# Patient Record
Sex: Female | Born: 1960
Health system: Southern US, Community
[De-identification: ages and names within clinical notes are randomized; demographics above are authoritative.]

## PROBLEM LIST (undated history)

## (undated) DIAGNOSIS — F419 Anxiety disorder, unspecified: Secondary | ICD-10-CM

## (undated) DIAGNOSIS — M3313 Other dermatomyositis without myopathy: Secondary | ICD-10-CM

## (undated) DIAGNOSIS — R519 Headache, unspecified: Secondary | ICD-10-CM

## (undated) DIAGNOSIS — G43909 Migraine, unspecified, not intractable, without status migrainosus: Secondary | ICD-10-CM

## (undated) DIAGNOSIS — D75839 Thrombocytosis, unspecified: Secondary | ICD-10-CM

## (undated) DIAGNOSIS — M3390 Dermatopolymyositis, unspecified, organ involvement unspecified: Secondary | ICD-10-CM

## (undated) DIAGNOSIS — R51 Headache: Secondary | ICD-10-CM

## (undated) HISTORY — PX: ABDOMINAL HYSTERECTOMY: SHX81

## (undated) HISTORY — DX: Anxiety disorder, unspecified: F41.9

## (undated) HISTORY — PX: BUNIONECTOMY: SHX129

## (undated) HISTORY — DX: Thrombocytosis, unspecified: D75.839

## (undated) HISTORY — PX: NECK SURGERY: SHX720

---

## 2001-05-14 ENCOUNTER — Encounter (HOSPITAL_COMMUNITY): Admission: RE | Admit: 2001-05-14 | Discharge: 2001-06-13 | Payer: Self-pay | Admitting: Oncology

## 2001-05-14 ENCOUNTER — Encounter: Admission: RE | Admit: 2001-05-14 | Discharge: 2001-05-14 | Payer: Self-pay | Admitting: Oncology

## 2001-06-27 ENCOUNTER — Encounter (HOSPITAL_COMMUNITY): Admission: RE | Admit: 2001-06-27 | Discharge: 2001-07-27 | Payer: Self-pay | Admitting: Oncology

## 2001-06-27 ENCOUNTER — Encounter: Admission: RE | Admit: 2001-06-27 | Discharge: 2001-06-27 | Payer: Self-pay | Admitting: Oncology

## 2002-08-18 ENCOUNTER — Emergency Department (HOSPITAL_COMMUNITY): Admission: EM | Admit: 2002-08-18 | Discharge: 2002-08-18 | Payer: Self-pay | Admitting: Internal Medicine

## 2002-09-10 ENCOUNTER — Ambulatory Visit (HOSPITAL_COMMUNITY): Admission: RE | Admit: 2002-09-10 | Discharge: 2002-09-10 | Payer: Self-pay | Admitting: Internal Medicine

## 2002-09-10 ENCOUNTER — Encounter: Payer: Self-pay | Admitting: Internal Medicine

## 2002-09-25 ENCOUNTER — Encounter: Payer: Self-pay | Admitting: Neurosurgery

## 2002-09-25 ENCOUNTER — Encounter: Admission: RE | Admit: 2002-09-25 | Discharge: 2002-09-25 | Payer: Self-pay | Admitting: Neurosurgery

## 2002-10-15 ENCOUNTER — Encounter: Payer: Self-pay | Admitting: Neurosurgery

## 2002-10-15 ENCOUNTER — Encounter: Admission: RE | Admit: 2002-10-15 | Discharge: 2002-10-15 | Payer: Self-pay | Admitting: Neurosurgery

## 2002-11-29 ENCOUNTER — Emergency Department (HOSPITAL_COMMUNITY): Admission: EM | Admit: 2002-11-29 | Discharge: 2002-11-29 | Payer: Self-pay | Admitting: Emergency Medicine

## 2002-12-02 ENCOUNTER — Encounter: Payer: Self-pay | Admitting: Neurosurgery

## 2002-12-02 ENCOUNTER — Inpatient Hospital Stay (HOSPITAL_COMMUNITY): Admission: RE | Admit: 2002-12-02 | Discharge: 2002-12-06 | Payer: Self-pay | Admitting: Neurosurgery

## 2003-04-14 ENCOUNTER — Ambulatory Visit (HOSPITAL_COMMUNITY): Admission: RE | Admit: 2003-04-14 | Discharge: 2003-04-14 | Payer: Self-pay | Admitting: Internal Medicine

## 2003-04-14 ENCOUNTER — Encounter: Payer: Self-pay | Admitting: Internal Medicine

## 2003-05-01 ENCOUNTER — Encounter: Payer: Self-pay | Admitting: Internal Medicine

## 2003-05-01 ENCOUNTER — Ambulatory Visit (HOSPITAL_COMMUNITY): Admission: RE | Admit: 2003-05-01 | Discharge: 2003-05-01 | Payer: Self-pay | Admitting: Internal Medicine

## 2004-08-15 ENCOUNTER — Emergency Department (HOSPITAL_COMMUNITY): Admission: EM | Admit: 2004-08-15 | Discharge: 2004-08-15 | Payer: Self-pay | Admitting: Emergency Medicine

## 2004-08-16 ENCOUNTER — Inpatient Hospital Stay (HOSPITAL_COMMUNITY): Admission: EM | Admit: 2004-08-16 | Discharge: 2004-08-20 | Payer: Self-pay | Admitting: *Deleted

## 2004-11-04 ENCOUNTER — Ambulatory Visit (HOSPITAL_COMMUNITY): Admission: RE | Admit: 2004-11-04 | Discharge: 2004-11-04 | Payer: Self-pay | Admitting: Internal Medicine

## 2005-01-21 ENCOUNTER — Ambulatory Visit: Payer: Self-pay | Admitting: Internal Medicine

## 2005-01-24 ENCOUNTER — Ambulatory Visit (HOSPITAL_COMMUNITY): Admission: RE | Admit: 2005-01-24 | Discharge: 2005-01-24 | Payer: Self-pay | Admitting: Internal Medicine

## 2005-02-04 ENCOUNTER — Ambulatory Visit: Payer: Self-pay | Admitting: Internal Medicine

## 2005-02-04 ENCOUNTER — Ambulatory Visit (HOSPITAL_COMMUNITY): Admission: RE | Admit: 2005-02-04 | Discharge: 2005-02-04 | Payer: Self-pay | Admitting: Internal Medicine

## 2005-03-29 ENCOUNTER — Ambulatory Visit: Payer: Self-pay | Admitting: Internal Medicine

## 2005-10-12 ENCOUNTER — Ambulatory Visit (HOSPITAL_COMMUNITY): Admission: RE | Admit: 2005-10-12 | Discharge: 2005-10-12 | Payer: Self-pay | Admitting: Internal Medicine

## 2006-03-24 ENCOUNTER — Emergency Department (HOSPITAL_COMMUNITY): Admission: EM | Admit: 2006-03-24 | Discharge: 2006-03-24 | Payer: Self-pay | Admitting: Emergency Medicine

## 2008-04-07 ENCOUNTER — Ambulatory Visit (HOSPITAL_COMMUNITY): Admission: RE | Admit: 2008-04-07 | Discharge: 2008-04-07 | Payer: Self-pay | Admitting: Internal Medicine

## 2008-06-30 ENCOUNTER — Emergency Department (HOSPITAL_COMMUNITY): Admission: EM | Admit: 2008-06-30 | Discharge: 2008-06-30 | Payer: Self-pay | Admitting: Emergency Medicine

## 2008-11-17 ENCOUNTER — Encounter: Payer: Self-pay | Admitting: Orthopedic Surgery

## 2008-11-18 ENCOUNTER — Encounter: Payer: Self-pay | Admitting: Orthopedic Surgery

## 2008-11-18 ENCOUNTER — Ambulatory Visit (HOSPITAL_COMMUNITY): Admission: RE | Admit: 2008-11-18 | Discharge: 2008-11-18 | Payer: Self-pay | Admitting: Orthopedic Surgery

## 2008-11-19 ENCOUNTER — Ambulatory Visit: Payer: Self-pay | Admitting: Orthopedic Surgery

## 2008-11-19 DIAGNOSIS — M79609 Pain in unspecified limb: Secondary | ICD-10-CM

## 2008-11-19 DIAGNOSIS — G5760 Lesion of plantar nerve, unspecified lower limb: Secondary | ICD-10-CM | POA: Insufficient documentation

## 2008-12-31 ENCOUNTER — Ambulatory Visit: Payer: Self-pay | Admitting: Orthopedic Surgery

## 2008-12-31 DIAGNOSIS — M21619 Bunion of unspecified foot: Secondary | ICD-10-CM

## 2009-01-06 ENCOUNTER — Telehealth: Payer: Self-pay | Admitting: Orthopedic Surgery

## 2009-03-19 ENCOUNTER — Telehealth: Payer: Self-pay | Admitting: Orthopedic Surgery

## 2009-06-12 ENCOUNTER — Emergency Department: Payer: Self-pay | Admitting: Emergency Medicine

## 2009-06-14 ENCOUNTER — Encounter: Payer: Self-pay | Admitting: Emergency Medicine

## 2009-06-14 ENCOUNTER — Inpatient Hospital Stay (HOSPITAL_COMMUNITY): Admission: EM | Admit: 2009-06-14 | Discharge: 2009-06-19 | Payer: Self-pay | Admitting: Internal Medicine

## 2009-07-15 ENCOUNTER — Ambulatory Visit (HOSPITAL_COMMUNITY): Admission: RE | Admit: 2009-07-15 | Discharge: 2009-07-15 | Payer: Self-pay | Admitting: General Surgery

## 2009-07-15 ENCOUNTER — Encounter (INDEPENDENT_AMBULATORY_CARE_PROVIDER_SITE_OTHER): Payer: Self-pay | Admitting: General Surgery

## 2011-01-16 ENCOUNTER — Encounter: Payer: Self-pay | Admitting: Obstetrics and Gynecology

## 2011-01-16 ENCOUNTER — Encounter: Payer: Self-pay | Admitting: Unknown Physician Specialty

## 2011-03-31 ENCOUNTER — Other Ambulatory Visit (HOSPITAL_COMMUNITY): Payer: Self-pay | Admitting: Family Medicine

## 2011-03-31 DIAGNOSIS — Z01419 Encounter for gynecological examination (general) (routine) without abnormal findings: Secondary | ICD-10-CM

## 2011-04-04 LAB — CBC
HCT: 40.1 % (ref 36.0–46.0)
HCT: 39 % (ref 36.0–46.0)
HCT: 40.4 % (ref 36.0–46.0)
Hemoglobin: 14.3 g/dL (ref 12.0–15.0)
MCHC: 35.7 g/dL (ref 30.0–36.0)
MCHC: 34.6 g/dL (ref 30.0–36.0)
MCV: 91.9 fL (ref 78.0–100.0)
MCV: 93.3 fL (ref 78.0–100.0)
Platelets: 126 10*3/uL — ABNORMAL LOW (ref 150–400)
Platelets: 110 10*3/uL — ABNORMAL LOW (ref 150–400)
Platelets: 115 10*3/uL — ABNORMAL LOW (ref 150–400)
RBC: 4.37 MIL/uL (ref 3.87–5.11)
RBC: 3.94 MIL/uL (ref 3.87–5.11)
RDW: 12.8 % (ref 11.5–15.5)
RDW: 12.7 % (ref 11.5–15.5)
RDW: 12.9 % (ref 11.5–15.5)
WBC: 5.8 10*3/uL (ref 4.0–10.5)
WBC: 12.8 10*3/uL — ABNORMAL HIGH (ref 4.0–10.5)

## 2011-04-04 LAB — COMPREHENSIVE METABOLIC PANEL
ALT: 148 U/L — ABNORMAL HIGH (ref 0–35)
ALT: 164 U/L — ABNORMAL HIGH (ref 0–35)
AST: 133 U/L — ABNORMAL HIGH (ref 0–37)
Alkaline Phosphatase: 66 U/L (ref 39–117)
BUN: 11 mg/dL (ref 6–23)
CO2: 26 mEq/L (ref 19–32)
Calcium: 9.1 mg/dL (ref 8.4–10.5)
Calcium: 9.6 mg/dL (ref 8.4–10.5)
Chloride: 105 mEq/L (ref 96–112)
Creatinine, Ser: 0.6 mg/dL (ref 0.4–1.2)
GFR calc Af Amer: >60 mL/min (ref >60)
GFR calc non Af Amer: >60 mL/min (ref >60)
Glucose, Bld: 145 mg/dL — ABNORMAL HIGH (ref 70–99)
Glucose, Bld: 96 mg/dL (ref 70–99)
Sodium: 138 mEq/L (ref 135–145)
Sodium: 140 mEq/L (ref 135–145)
Total Bilirubin: 0.4 mg/dL (ref 0.3–1.2)
Total Protein: 6.5 g/dL (ref 6.0–8.3)
Total Protein: 6.8 g/dL (ref 6.0–8.3)

## 2011-04-04 LAB — JO-1 ANTIBODY-IGG: Jo-1 Antibody, IgG: <0.2 AI (ref <1.0)

## 2011-04-04 LAB — BASIC METABOLIC PANEL
BUN: 13 mg/dL (ref 6–23)
CO2: 30 mEq/L (ref 19–32)
Calcium: 9.6 mg/dL (ref 8.4–10.5)
Chloride: 97 mEq/L (ref 96–112)
Creatinine, Ser: 0.61 mg/dL (ref 0.4–1.2)
GFR calc Af Amer: >60 mL/min (ref >60)
GFR calc non Af Amer: >60 mL/min (ref >60)
Glucose, Bld: 119 mg/dL — ABNORMAL HIGH (ref 70–99)
Potassium: 3.7 mEq/L (ref 3.5–5.1)
Sodium: 137 mEq/L (ref 135–145)

## 2011-04-04 LAB — DIFFERENTIAL
Basophils Absolute: 0 10*3/uL (ref 0.0–0.1)
Basophils Relative: 0 % (ref 0–1)
Eosinophils Absolute: 0 10*3/uL (ref 0.0–0.7)
Eosinophils Relative: 1 % (ref 0–5)
Lymphocytes Relative: 13 % (ref 12–46)
Lymphs Abs: 0.8 10*3/uL (ref 0.7–4.0)
Monocytes Absolute: 0.3 10*3/uL (ref 0.1–1.0)
Monocytes Relative: 6 % (ref 3–12)
Neutro Abs: 4.7 10*3/uL (ref 1.7–7.7)
Neutrophils Relative %: 81 % — ABNORMAL HIGH (ref 43–77)

## 2011-04-04 LAB — URINALYSIS, ROUTINE W REFLEX MICROSCOPIC
Bilirubin Urine: NEGATIVE
Glucose, UA: NEGATIVE mg/dL
Hgb urine dipstick: NEGATIVE
Ketones, ur: NEGATIVE mg/dL
Nitrite: NEGATIVE
Protein, ur: NEGATIVE mg/dL
Specific Gravity, Urine: 1.01 (ref 1.005–1.030)
Urobilinogen, UA: 0.2 mg/dL (ref 0.0–1.0)
pH: 7.5 (ref 5.0–8.0)

## 2011-04-04 LAB — SEDIMENTATION RATE: Sed Rate: 8 mm/hr (ref 0–22)

## 2011-04-04 LAB — RHEUMATOID FACTOR: Rhuematoid fact SerPl-aCnc: <20 IU/mL (ref 0–20)

## 2011-04-04 LAB — LACTATE DEHYDROGENASE: LDH: 213 U/L (ref 94–250)

## 2011-04-04 LAB — GLUCOSE, CAPILLARY
Glucose-Capillary: 120 mg/dL — ABNORMAL HIGH (ref 70–99)
Glucose-Capillary: 145 mg/dL — ABNORMAL HIGH (ref 70–99)
Glucose-Capillary: 154 mg/dL — ABNORMAL HIGH (ref 70–99)
Glucose-Capillary: 160 mg/dL — ABNORMAL HIGH (ref 70–99)
Glucose-Capillary: 163 mg/dL — ABNORMAL HIGH (ref 70–99)
Glucose-Capillary: 195 mg/dL — ABNORMAL HIGH (ref 70–99)

## 2011-04-04 LAB — CK
Total CK: 992 U/L — ABNORMAL HIGH (ref 7–177)
Total CK: 821 U/L — ABNORMAL HIGH (ref 7–177)
Total CK: 896 U/L — ABNORMAL HIGH (ref 7–177)

## 2011-04-04 LAB — VITAMIN B12: Vitamin B-12: 331 pg/mL (ref 211–911)

## 2011-04-04 LAB — CHOLINESTERASE, RBC: Cholinesterase, RBC: 37 U/g{Hb} (ref 25–52)

## 2011-04-04 LAB — B. BURGDORFI ANTIBODIES: B burgdorferi Ab IgG+IgM: 0.12 {ISR}

## 2011-04-04 LAB — SJOGRENS SYNDROME-B EXTRACTABLE NUCLEAR ANTIBODY: SSB (La) (ENA) Antibody, IgG: <0.2 AI (ref <1.0)

## 2011-04-04 LAB — ROCKY MTN SPOTTED FVR AB, IGM-BLOOD: RMSF IgM: 0.19 IV (ref 0.00–0.89)

## 2011-04-04 LAB — ROCKY MTN SPOTTED FVR AB, IGG-BLOOD: RMSF IgG: 0.06 IV

## 2011-04-04 LAB — MPO/PR-3 (ANCA) ANTIBODIES

## 2011-04-04 LAB — ANTI-NEUTROPHIL ANTIBODY

## 2011-04-07 ENCOUNTER — Other Ambulatory Visit (HOSPITAL_COMMUNITY): Payer: Self-pay

## 2011-04-07 ENCOUNTER — Ambulatory Visit (HOSPITAL_COMMUNITY): Payer: Self-pay

## 2011-04-11 ENCOUNTER — Ambulatory Visit (HOSPITAL_COMMUNITY)
Admission: RE | Admit: 2011-04-11 | Discharge: 2011-04-11 | Disposition: A | Discharge status: Home / Self Care | Payer: 59 | Source: Ambulatory Visit | Attending: Family Medicine | Admitting: Family Medicine

## 2011-04-11 DIAGNOSIS — Z01419 Encounter for gynecological examination (general) (routine) without abnormal findings: Secondary | ICD-10-CM

## 2011-04-11 DIAGNOSIS — M818 Other osteoporosis without current pathological fracture: Secondary | ICD-10-CM | POA: Insufficient documentation

## 2011-04-11 DIAGNOSIS — Z1231 Encounter for screening mammogram for malignant neoplasm of breast: Secondary | ICD-10-CM | POA: Insufficient documentation

## 2011-05-10 NOTE — Op Note (Signed)
NAMEJAMESETTA, Amy Goodman              ACCOUNT NO.:  [id number]   MEDICAL RECORD NO.:  [id number]          PATIENT TYPE:  AMB   LOCATION:  DAY                          FACILITY:  [facility]   PHYSICIAN:  Almond Lint, MD       DATE OF BIRTH:  1961/02/25   DATE OF PROCEDURE:  07/15/2009  DATE OF DISCHARGE:                               OPERATIVE REPORT   PREOPERATIVE DIAGNOSIS:  History of myositis.   POSTOPERATIVE DIAGNOSIS:  History of myositis.   PROCEDURE PERFORMED:  Left deltoid biopsy.   SURGEON:  Dr. Donell Beers.   ASSISTANT:  None.   ANESTHESIA:  General.   FINDINGS:  Normal-appearing muscle.   SPECIMEN:  Muscle biopsy x3 to Pathology.   ESTIMATED BLOOD LOSS:  Minimal.   COMPLICATIONS:  None known.   PROCEDURE:  [name] is a 50 year old female who was identified in the  holding area and taken to the operating room where she was placed supine  on the operating room table.  General anesthesia was induced.  Her left  arm was prepped and draped in a sterile fashion.  The time-out was  performed according to the surgical safety check list.  When all was  correct, we continued.  A longitudinal incision was made overlying the  posterior deltoid on the left.  The subcutaneous tissue was divided with  Bovie electrocautery.  A Weitlaner self-retaining retractor was used to  retract the subcutaneous tissue of the way.  The deltoid fascia was  entered sharply with Metzenbaum scissors.  A 2.5 x 0.5 cm piece of  muscle was elevated and dissected free from the surrounding muscle.  This was clamped at both ends with a right angle clamp and cut with  Metzenbaum scissors.  The muscle biopsy was secured to the tongue  depressor with silk ties.  The right angle clamp was then tied off with  2-0 silk ties as well.  This was repeated x2 in adjacent portions of the  deltoid.  There was no visualization of any nerves or large vascular  structures.  One very small vein was coagulated.  The deltoid  fascia was  then closed back over the muscle biopsy site with 2-0 Prolene.  The  subcutaneous tissue was reapproximated with 3-0 Vicryl deep dermal  sutures.  The skin was then run using a 4-0 Monocryl.  The skin was  cleaned, dried and dressed with Benzoin and Steri-Strips and Coverderm.  The patient was awakened from anesthesia and taken to the PACU in stable  condition.  Needle and sponge counts were correct x2.  Special care was  taken after muscle biopsy to hold pressure for several minutes given  that the patient had a  history of bleeding.  She has had this worked up extensively and no  definable bleeding defect has ever been found.  She had a hysterectomy  recently without any bleeding complications.  However, in light of this  history, special care was taken to hold pressure for several minutes and  to take extra time observing for hemostasis.      Almond Lint, MD  Electronically  Signed     FB/MEDQ  D:  07/15/2009  T:  07/15/2009  Job:  [id number]   cc:   Deanna Artis. Sharene Skeans, M.D.  Fax: [phone]   Pathology

## 2011-05-10 NOTE — Consult Note (Signed)
Amy Goodman NO.:  [id number]   MEDICAL RECORD NO.:  [id number]          PATIENT TYPE:  INP   LOCATION:  5157                         FACILITY:  [facility]   PHYSICIAN:  Deanna Artis. Hickling, M.D.DATE OF BIRTH:  12/02/1961   DATE OF CONSULTATION:  06/15/2009  DATE OF DISCHARGE:                                 CONSULTATION   CHIEF COMPLAINT:  Muscle tenderness and weakness of 1 week duration in  association with vasculitic rash.   HISTORY OF PRESENT CONDITION:  The patient is a 50 year old woman who  has had previous history of cervical spondylosis, depression, and  anxiety who has had a cervical diskectomy.   She had fairly sudden onset of rash on the dorsum of her hands and on  her right flank.  It was thought to be related to poison ivy.   She was seen by Dr. Carylon Perches, her primary physician and was placed on  the prednisone steroid taper dose.   The patient had increasing weakness and fatigue.  She had difficulty  using her arms to do simple activities of daily living such as washing  her face, combing her hair.  She felt that her arms were tight and her  hands were tight and indeed they seemed to be somewhat swollen.   She was seen in emergency room at [facility] where she was told she had a  reaction to the steroid and was given pain medication and allergy  medications on home.  She was brought to [facility] where she  was transferred with a CK that was 960.  CT scan of the abdomen and  pelvis were unremarkable.   The patient's review of systems was remarkable for right-sided headache  that she says is migrainous.  She has had migraines in the past.  She  has also had no nausea and abdominal pain that is hypogastric.  She has  had the hypogastric pain and back pain which she experiences now in the  past but never has had weakness.   The patient's rashes persisted despite the low-dose steroids.  Her main  complaint now is pain in her upper  extremities that makes it hard for  her to carry out any movements or activities.   CURRENT MEDICATIONS:  Tramadol, ciprofloxacin, and steroids.   DRUG ALLERGIES:  None known.   PAST SURGICAL PROCEDURES:  Hysterectomy and cervical diskectomy.   REVIEW OF SYSTEMS:  Otherwise negative 12-system review.   SOCIAL HISTORY:  The patient does not use alcohol, tobacco, or drugs.  She is here in the room with her mother and another relative.   PHYSICAL EXAMINATION:  VITAL SIGNS:  Today, temperature 92, blood  pressure 114/80, resting pulse 88, and respirations 20.  HEAD, EYES, EARS, NOSE, AND THROAT:  No signs of infection.  NECK:  She has a supple neck with full range of motion but there is some  tenderness in an effort if she flexes it.  LUNGS:  Clear to auscultation.  HEART:  No murmurs.  Pulses normal.  ABDOMEN:  Soft and nontender.  Bowel sounds normal.  No  hepatosplenomegaly.  EXTREMITIES:  Well-formed.  She had definite tightness in her arms which  was not present in her legs.  SKIN:  She has an erythematous macular papular rash across her knuckles  and also along the right flank in the region of the hip and upper femur.  Her rash does not fit within a dermatomal pattern.  It appears  vasculitic in nature.  NEUROLOGIC:  Mental status:  The patient is awake, alert, pleasant, and  cooperative.  No dysphasia or dyspraxia.  Cranial nerves:  Round  reactive pupils.  Visual fields full.  Extraocular movements full and  conjugate.  Symmetric facial strength with good tight eyelid closure.  No signs of facial weakness.  Midline tongue and uvula.  Motor  examination:  The patient had 4+/5 strength in her biceps, triceps,  deltoid, 5/5 wrist extensors and flexors, and 5 minus in her grips.  She  was able to oppose her thumb and her fingers.  She had 5/5 strength in  her legs.  Sensation showed no evidence of definite peripheral  neuropathy to cold, vibration, proprioception, or  stereoagnosis.  Her  gait is slightly antalgic but she is able walk with her feet  straightforward without lurching and without obvious ataxia.  She had  negative Romberg response.  She is able to get up on her toes with some  difficulty with the left toe because it has been dislocated but she is  able to walk on her heels.  Her Romberg response is negative.  Deep  tendon reflexes are absent.  The patient had bilateral flexor plantar  responses.   IMPRESSION:  This patient has some form of inflammatory myopathy.  Whether this is viral dermatomyositis or polymyositis is unclear.  Inclusion body myositis would also be a possibility.   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  The patient with dermatomyositis also have occasional occult  malignancies.  The patient has not had any fever, night sweats, or  weight loss.   LABORATORY STUDIES:  Sodium 142, potassium 3.9, chloride 107, CO2 29,  BUN 11, creatinine 0.6, and glucose 96.  White blood cell count 5700,  hemoglobin 13.5, and platelet count 116,000.  AST elevated 125 and ALT  elevated at 142.  CK elevated at 96.  CT scan of the abdomen showed no  definite abnormalities.   She has a collagen vascular workup in progress to which I will add SSA  and SSB as well as P-ANCA.  I have also contacted [facility]  and asked them to come by and look at the rash and possibly biopsy so  that we could determine the etiology of it.  I will place her on pulse  steroids at a dose of 250 mg every 6 hours and will see how she does.   I appreciate the opportunity to participate in her care.      Deanna Artis. Sharene Skeans, M.D.  Electronically Signed     WHH/MEDQ  D:  06/15/2009  T:  06/16/2009  Job:  [id number]   cc:   Kingsley Callander. Ouida Sills, MD  Beckey Rutter, MD

## 2011-05-10 NOTE — Discharge Summary (Signed)
Amy Goodman, DUDZINSKI NO.:  [id number]   MEDICAL RECORD NO.:  [id number]          PATIENT TYPE:  INP   LOCATION:  5157                         FACILITY:  [facility]   PHYSICIAN:  Elliot Cousin, M.D.    DATE OF BIRTH:  21-Apr-1961   DATE OF ADMISSION:  06/14/2009  DATE OF DISCHARGE:  06/19/2009                               DISCHARGE SUMMARY   DISCHARGE DIAGNOSES:  1. Dermatomyositis (of unknown etiology).  2. Gottron's papules associated with dermatomyositis (status post      shave biopsy) by dermatologist Dr. Terri Piedra.  3. Hepatic transaminitis, thought to be secondary to dermatomyositis.  4. Transient leukopenia and chronic thrombocytopenia.  According to      the patient, she underwent an extensive evaluation in the past at      [facility] for a nonspecific hematological      disorder.  5. Hyperglycemia, secondary to steroids.  6. The patient is not allergic to prednisone.  7. Incidental finding of 3-mm subpleural nodule in the right lower      lobe of the lung ( CT scan of the chest in 6 months is needed to      assess for stability).  8. Stable right hepatic lobe hemangioma.   DISCHARGE MEDICATIONS:  1. Prednisone 20 mg 3 tablets daily or as directed.  2. Protonix 40 mg daily.  3. Tramadol 25 mg 1-2 tablets every 6 hours as needed for pain.   DISCHARGE DISPOSITION:  The patient is being discharged to home in  improved and stable condition.  She was advised to follow up with Dr.  Sharene Skeans in 1 week and with her primary care physician, Dr. Ouida Sills in 1-2  weeks.   CONSULTATIONS:  1. Deanna Artis. Hickling, MD  2. Frederick A. Terri Piedra, MD   PROCEDURES PERFORMED:  1. Status post shave biopsy of right hand by Dr. Terri Piedra on June 15, 2009.  2. CT scan of the abdomen and pelvis with contrast on June 18, 2009.      The results revealed no acute findings.  Stable right hepatic lobe      hemangioma.  Interval thickening of the iliacus  musculature      compared to the prior CT scan.  This could be a secondary sign of      suspected myositis/myopathy.  CT scan of the chest revealed a 3-mm      subpleural nodule in the right lower lobe likely to be incidental      and benign.  3. MRI of the lower extremities bilaterally.  The results revealed a      normal examination.  No findings to explain the patient's symptoms.      Negative for myositis or myopathy.  4. Chest x-ray on June 15, 2009.  The results revealed no acute      cardiopulmonary disease.   HISTORY OF PRESENT ILLNESS:  The patient is a 50 year old woman with a  past medical history significant for a total hysterectomy for  endometriosis 1 year ago and chronic thrombocytopenia, who presented to  [facility] for further evaluation of generalized fatigue and a  rash.  Apparently, the patient had been evaluated at [facility]  for muscle aches, generalized weakness, and a rash.  When she was  evaluated in the Emergency Department at [facility], her CPK  was found to be in the 900.  A CT scan of the abdomen and pelvis was  ordered and it revealed no significant for acute abnormalities.  Prior  to the patient's presentation to [facility], she had been  treated with prednisone by her primary care physician for was thought to  be poisin ivy.  When the rash did not subside, she went to the emergency  department at [facility] where she was told she had a  reaction to the prednisone.   The patient desired transfer to [facility] for further  evaluation by the [facility] neurologists here.  Therefore,  she was transferred to [facility] for further evaluation.  For  additional details, please see the dictated history and physical.   HOSPITAL COURSE:  1. Dermatomyositis.  As stated above, the patient's CPK was greater      than 900.  Myositis and/or myopathy were concerns.  She was started       on symptomatic treatment with as-needed analgesics.  For further      evaluation, a number of studies were ordered including CPK,      aldolase, LDH, ANA, sed rate, anti-Jo antibodies, and rheumatoid      factor.  In addition, an MRI of the lower extremities was ordered      as the patient's rash was primarily on both of her proximal legs,      although she also had a smaller rash on both hands.  Her followup      CPK was 896.  Her LDH was within normal limits at 213.  Her      rheumatoid factor was less than 20.  Her sed rate was only 8.  The      Jo-1 antibody IgE was less than 0.2.  Aldolase was elevated at      15.5. Her ANA was negative.  The MRI of both proximal legs were      unremarkable.  Additional laboratory studies were ordered including      Lyme disease and [facility] spotted fever titers.  Both      serologies were essentially negative.  Because of the transient      leukopenia and chronic thrombocytopenia, HIV antibody and vitamin      B12 were ordered for evaluation.  The HIV antibody was nonreactive      and her vitamin B12 level was within normal limits at 331.      Subsequently, neurologist, Dr. Sharene Skeans was consulted.  He      evaluated the patient and believed that the patient had some form      of inflammatory myopathy, whether it was a viral dermatomyositis or      polymyositis was unclear to him.  He also stated that inclusion      body myositis was a possibility.  He started high-dose intravenous      Solu-Medrol (250 mg IV every 6 hours).  He consulted dermatologist,      Dr. Terri Piedra for a biopsy;  which was performed shortly thereafter.      Dr. Sharene Skeans also ordered additional studies including PAN-ANCA and      RBC cholinesterase.  The PAN-ANCA was essentially negative and the      RBC cholinesterase was 37, within normal limits.  It was also noted      that the patient's liver transaminases were modestly-to-moderately      elevated.  Her SGOT ranged from  120-130 and her SGPT ranged from      140-164.  Given the elevation, an acute viral hepatitis panel was      ordered and it was completely negative.  The elevated liver      transaminases was thought to be a consequence of the      dermatomyositis.   Dr. Sharene Skeans was informed by Dr. Terri Piedra that the biopsy was positive for  a vasculitis consistent with dermatomyositis.  Also noted were Gottron's  papules.  With the intravenous steroids, the extent of her muscle  weakness and fatigue subsided.  The extent of the erythema and edema  surrounding the rash subsided as well.  Over the past 2 days, the  patient has been ambulating in the hallway 2-3 times daily.   Because of the high-dose intravenous steroids, her capillary blood  glucose became elevated.  She was treated accordingly with sliding scale  NovoLog and Lantus.  Now that the Solu-Medrol has been titrated off, her  capillary blood glucose has normalized.  The patient was also noted to  have leukopenia and thrombocytopenia.  Her platelet count generally  ranged from 112-126.  In my conversation with her, she disclosed that  she underwent an extensive evaluation a couple of years ago at [facility] for chronic thrombocytopenia.  According to  the patient, the hematological disorder was not really clarified or  classified.  However, she was told that her platelet count would need to  be monitored.  Her white blood cell count normalized prior to discharge.   To assess for a possible malignancy, CT scans of the chest, abdomen, and  pelvis were ordered by Dr. Sharene Skeans.  The results are indicated above.  There was no evidence of a malignancy, although there was a tiny  subpleural lung lesion that can be followed every 6 months with serial  CT scans.  Dr. Sharene Skeans evaluated the patient yesterday and  again today and recommended discontinuation of the Solu-Medrol.  He  advised ongoing steroid therapy with prednisone  daily until he  reevaluates her.  The patient will follow up with Dr. Sharene Skeans in 1  week.  There is a possibility that the patient may need a muscle biopsy.      Elliot Cousin, M.D.  Electronically Signed     DF/MEDQ  D:  06/19/2009  T:  06/19/2009  Job:  [id number]   cc:   Kingsley Callander. Ouida Sills, MD  Deanna Artis. Sharene Skeans, M.D.

## 2011-05-10 NOTE — H&P (Signed)
Amy Goodman, KATAOKA NO.:  [id number]   MEDICAL RECORD NO.:  [id number]          PATIENT TYPE:  INP   LOCATION:  5157                         FACILITY:  [facility]   PHYSICIAN:  Eduard Clos, MDDATE OF BIRTH:  22-Jun-1961   DATE OF ADMISSION:  06/14/2009  DATE OF DISCHARGE:                              HISTORY & PHYSICAL   PRIMARY CARE PHYSICIAN:  Dr. Ouida Sills in [place].   CHIEF COMPLAINT:  Generalized fatigue and rash.   HISTORY OF PRESENT ILLNESS:  A 50 year old female with no significant  past medical history developed some rash in her hand a week ago when she  was given a prednisone steroid taper dose.  The patient eventually  started becoming more and more weak with fatigue.  She is not able to  wear her dress, not able to comb her hair and not able to stand up from  a sitting position.  She went to an ER in [place], where she was told  that she probably had some reaction to the steroid and was given some  pain medication and anti-allergy medication and sent home.  Today, she  came to [facility] and she was transferred here for further  workup on her myopathy.  In the ER at [facility], CPK was found  to be in the 900s with CAT scan of the abdomen and pelvis showing no  significant acute abnormalities.  The patient also had some abdominal  pain.  The patient presently is being transferred for further  management.   The patient denies any chest pain or shortness of breath.  Denies any  headache, fever, chills, nausea, vomiting, diarrhea, dysuria,  discharges, fever or chills.   The patient's weakness is mostly proximal muscles with some stiffness in  both hands.  The patient also noticed a rash in the knuckles of right  hand which still persists over the last 1-week.  It is pinkish in color.  The patient also has a rash in the proximal thigh area of her right leg.  The patient's main complaint is generalized fatigue, weakness and  proximal muscle weakness in particular.   PAST MEDICAL HISTORY:  Nothing significant.   PAST SURGICAL HISTORY:  Total hysterectomy for endometriosis a year ago.   MEDICATIONS PRIOR TO ADMISSION:  The patient was recently started on  tramadol, Cipro, and was on a tapering steroid dose which was held  already.   ALLERGIES:  PENICILLIN.   FAMILY HISTORY:  Nothing significant.   SOCIAL HISTORY:  The patient denies smoking cigarettes.  Alcohol  occasionally.  Denies any drug abuse.   REVIEW OF SYSTEMS:  As per history of present illness, nothing else  significant.   PHYSICAL EXAMINATION:  GENERAL:  Patient examined at bedside, not in  acute distress.  VITAL SIGNS:  Blood pressure is 95/62, pulse 90 per minute, temperature  98.2, respirations 18 per minute.  O2 saturations 99%.  HEENT:  Anicteric, no pallor.  No rash.  No tongue swelling.  No facial  edema.  PERLA positive.  NECK:  No neck rigidity.  CHEST:  Bilateral air  entry present.  No rhonchi, no crepitation.  HEART:  S1-S2 heard.  ABDOMEN:  Soft, nontender.  Bowel sounds heard.  CNS:  The patient is alert, awake and oriented to time, place and  person.  The patient has difficulty abducting her upper extremities more  than 90% because of weakness.  She is able to move her wrist and elbow  without any difficulty.  She has mild tenderness in both wrist areas.  There is also a rash in the right knuckles.  EXTREMITIES:  The patient's lower extremities are 5/5.  There is also  some rash on the right proximal thigh area.  Peripheral pulses felt.  There is a rash in the right knuckle area which is pinkish in color, non-  blanching.  The patient has a poor grip in both hands due to generalized  weakness of the upper extremities.  The patient's reflexes are intact.   LABORATORY DATA:  The patient had a CT of the abdomen and pelvis done  which showed no acute abnormalities.  Stable right hepatic lobe  hemangioma of liver.  No acute  pelvic abnormality.  CBC - WBC 5.8,  hemoglobin 14.3, hematocrit 40.1, platelet count 126, neutrophils 81%.  Basic metabolic panel; sodium 137, potassium 3.7, chloride 97, carbon  dioxide 30, glucose 119, BUN 13, creatinine 0.6, calcium 9.6, CK 992.  UA is negative for leukocytes.   ASSESSMENT:  Generalized fatigue with proximal muscle weakness and rash.  Myositis, particularly dermatomyositis.  Other differential diagnoses  includes polymyositis and inclusion body myositis.   PLAN:  1. We will admit to medical floor.  2. We will place the patient on GI and DVT prophylaxis.  3. We will repeat complete metabolic panel, CPK, __________, LDH, CBC,      ANA, sed rate, Anti-Jo-1 antibodies, rheumatoid factor.  4. We will get an MR of the proximal thigh muscles.  We will check TSH      along with other labs.  5. We will get a neurology consult.  At this time, I am not starting      the patient on any steroids.  We will await neurology consultation      and follow their recommendations.  The patient eventually may need      a muscle biopsy.  Further recommendations as condition evolves and      as per consult requested.      Eduard Clos, MD  Electronically Signed     ANK/MEDQ  D:  06/14/2009  T:  06/15/2009  Job:  [id number]   cc:   Kingsley Callander. Ouida Sills, MD

## 2011-05-13 NOTE — Discharge Summary (Signed)
NAME:  Amy Goodman, Amy Goodman                        ACCOUNT NO.:  [id number]   MEDICAL RECORD NO.:  [id number]                   PATIENT TYPE:  INP   LOCATION:  3011                                 FACILITY:  [facility]   PHYSICIAN:  Hilda Lias, M.D.                DATE OF BIRTH:  Apr 26, 1961   DATE OF ADMISSION:  12/02/2002  DATE OF DISCHARGE:  12/06/2002                                 DISCHARGE SUMMARY   ADMISSION DIAGNOSIS:  Cervical vertebrae-5/6 and cervical vertebrae-6/7  herniated disc.   DISCHARGE DIAGNOSES:  1. Cervical vertebrae-5/6 and cervical vertebrae-6/7 herniated disc.  2. Upper gastrointestinal bleed.   HISTORY OF PRESENT ILLNESS:  The patient was admitted because of neck pain  with radiation to the left upper extremity.  The patient was scheduled for  surgery on December 12, 2002 but she came to the emergency room complaining  of worsening of pain.  Because of that, we moved her surgery to December 02, 2002.   LABORATORY DATA:  At the present time, the white cell count is 7.4 and the  hemoglobin is 8.8.  PT 13.9.  Platelets 137,000.   HOSPITAL COURSE:  The patient was taken to surgery.  An anterior C5/6 and  C6/7 discectomy followed by fusion was done.  The patient did really well,  but at the time they were awakening her, they pulled out the NG tube,  immediately the patient started vomiting large amount of dark blood.  Because of that, we put her NG tube back and moved her up into the intensive  care unit.  She was kept on observation that night with vital signs and  CBC's.   The following day, we called the hematologist and gastroenterologist.  It  turned out on this patient, unknown to [place], that she has some problem with  bleeding in the past.  The patient was stable and there was no need to do  any further workup.  Eventually, hemoglobin stabilized at 8.8.  She had no  discomfort.  She had been eating.  She had some good bowel sounds and today  she was seen  by the gastroenterologist, Dr. Judie Petit T. Russella Dar, who felt that  the patient could go home.  She is also going to follow up with Dr. Ladona Horns.  Neijstrom, who is the hematologist.   CONDITION ON DISCHARGE:  Improved.   DISCHARGE MEDICATIONS:  Vicodin.  She is going to be taking Reglan and  Protonix.   DIET:  Regular.   ACTIVITY:  No driving and no lifting.   FOLLOW UP:  I will see her in my office in three or four weeks.  Any  questions with the GI problem she is to either call me, call her medical  doctor, or call Dr. Judie Petit T. Russella Dar.  Hilda Lias, M.D.   EB/MEDQ  D:  12/06/2002  T:  12/08/2002  Job:  [id number]

## 2011-05-13 NOTE — Consult Note (Signed)
NAME:  Amy Goodman, Amy Goodman                        ACCOUNT NO.:  [id number]   MEDICAL RECORD NO.:  [id number]                   PATIENT TYPE:  INP   LOCATION:  3011                                 FACILITY:  [facility]   PHYSICIAN:  Merilynn Finland, M.D.                DATE OF BIRTH:  July 09, 1961   DATE OF CONSULTATION:  12/03/2002  DATE OF DISCHARGE:  12/06/2002                                   CONSULTATION   REASON FOR CONSULTATION:  History of bleeding disorder.   HISTORY OF PRESENT ILLNESS:  This is a 50 year old female, status post C5-6  and C6-7 diskectomy with decompression of the spinal cord, bone bank graft,  and plate insertion.  Post surgical bleeding was noted and the family gave a  history of bleeding problems, prompting additional work-up.  The patient was  seen by Ladona Horns. Neijstrom, M.D., for easy bruisability.  Unfortunately, her  records have not been able to be located as reviewed at [facility],  either in the surgical clinic or in medical records.  The patient states  that she did have some positive tests, but cannot remember what they were.   PAST MEDICAL HISTORY:  1. Migraines.  2. Depression.   ALLERGIES:  PENICILLIN.   MEDICATIONS:  1. Imitrex.  2. Zoloft.  3. Birth control pills.   FAMILY HISTORY:  Positive for CAD.  Her mother is alive at age 48.  Her  father died of brain tumor.  She has two sisters and four brothers.  One  aunt and uncle have bone cancer and one has lung cancer.  One aunt had an MI  and one uncle had an MI.   SOCIAL HISTORY:  She is married.  Two stepdaughters.  She works as a Media planner for [employer].  Denies smoking.  Occasional glass of  wine socially.   REVIEW OF SYSTEMS:  History of headache.  No vision changes.  No chest or  pleuritic pain.  No shortness of breath.  There have been no GI symptoms,  but there has been increased fatigue and weakness.  There has also been  weakness in the left arm.  The  patient denies any fevers, night sweats, or  appetite changes.   PHYSICAL EXAMINATION:  GENERAL APPEARANCE:  This is a 50 year old white  female in the ICU with a soft cervical collar in place.Marland Kitchen  VITAL SIGNS:  Temperature 98.2 degrees, pulse 92, respirations 16, blood  pressure 88/54.  HEENT:  Normocephalic.  EOMI.  PERRLA.  NG tube intact.  Mucous membranes  moist.  Throat deferred due to cervical collar.  CHEST:  Decreased breath sounds bibasilar.  CARDIOVASCULAR:  S1 and split S2.  No murmur.  ABDOMEN:  Soft.  No organomegaly.  No masses.  EXTREMITIES:  No cyanosis, clubbing, or edema.  NEUROLOGIC:  4/5 strength in the right upper extremity and 5/5 in all  others.  Alert and oriented.  Cranial nerves II-XII intact.   LABORATORY DATA:  On December 02, 2002, WBC 13.7, hemoglobin 10.1, hematocrit  29.3, and platelets 107,000.  PT 14.5, INR 1.1, PTT 24.  The PTT on December 03, 2002, was 53.   ASSESSMENT AND PLAN:  Merilynn Finland, M.D., saw and examined the patient.  He notes a 50 year old female noted to have GI bleed post cervical  laminectomy.  She was last seen by Ladona Horns. Mariel Sleet, M.D., in November of  2001.  Records have been ordered from [facility].  Note that the  patient's last name was Cundiff at that time.  She is not having any  abnormal bleeding.  I would recommend follow-up with Ladona Horns. Mariel Sleet, M.D.  She seems to be recovering well.     Rosemarie Ax, N.P.                      Merilynn Finland, M.D.    JB/MEDQ  D:  12/06/2002  T:  12/07/2002  Job:  [id number]   cc:   Hilda Lias, M.D.  [address]  [place], [place] [address]  Fax: [phone]   Ladona Horns. Neijstrom, M.D.  [address]  [place]  [place] [address]  Fax: [phone]   J. Aliene Altes, M.D.  [address] [facility]  [place]  [place] [address]  Fax: [phone]

## 2011-05-13 NOTE — Discharge Summary (Signed)
NAME:  Amy Goodman, Amy Goodman                        ACCOUNT NO.:  [id number]   MEDICAL RECORD NO.:  [id number]                   PATIENT TYPE:  INP   LOCATION:  A307                                 FACILITY:  [facility]   PHYSICIAN:  Kingsley Callander. Ouida Sills, M.D.                  DATE OF BIRTH:  July 14, 1961   DATE OF ADMISSION:  08/16/2004  DATE OF DISCHARGE:  08/20/2004                                 DISCHARGE SUMMARY   DISCHARGE DIAGNOSES:  1. Infectious colitis.  2. Hypokalemia.   HOSPITAL COURSE:  This patient is a 50 year old white female who presented  with diarrhea and abdominal cramps.  She had also vomited at home.  Her  white count was 7.7 with a hemoglobin of 12.1.  She was hypokalemic at 3.4.  Her nausea and vomiting were treated with IV Phenergan.  She was hydrated  with normal saline.  Her potassium was supplemented intravenously.  A  urinalysis was abnormal, although she was currently menstruating.  A urine  culture was negative.  Stool studies were obtained and revealed evidence of  white cells, although her culture has been finalized now at negative.  Her  Clostridium difficile toxin was negative.  She was seen in consultation by  gastroenterology.  She underwent a flexible sigmoidoscopy and was found to  have a patchy colitis.  Her biopsy revealed a nonspecific colitis.  She was  treated with Cipro and Flagyl.  Her diarrhea improved.  Her diet was  advanced.  Her cramps improved.  She was stable for discharge on August 20, 2004.  She will continue outpatient antibiotic therapy and will follow up in  the office in one week.   DISCHARGE MEDICATIONS:  1. Cipro 500 mg b.i.d.  2. Flagyl 250 mg t.i.d. __________  3. Levsin q.i.d. p.r.n.   She was advised to eat a low residue diet and to supplement meals with  yogurt.     ___________________________________________                                         Kingsley Callander. Ouida Sills, M.D.   ROF/MEDQ  D:  08/20/2004  T:  08/20/2004  Job:  [id number]   cc:   Lionel December, M.D.  [address]  [place]  [place] [address]  Fax: [phone]   R. Roetta Sessions, M.D.  [address]  [place]  [place] [address]  Fax: [phone]

## 2011-05-13 NOTE — Op Note (Signed)
NAMESARAIA, PLATNER              ACCOUNT NO.:  [id number]   MEDICAL RECORD NO.:  [id number]          PATIENT TYPE:  AMB   LOCATION:  DAY                           FACILITY:  [facility]   PHYSICIAN:  Lionel December, M.D.    DATE OF BIRTH:  December 11, 1961   DATE OF PROCEDURE:  02/04/2005  DATE OF DISCHARGE:                                 OPERATIVE REPORT   PROCEDURE:  Colonoscopy with terminal ileoscopy.   INDICATION:  Wilmary is a 50 year old Caucasian female who was admitted with  acute colitis back in August 2005.  She had flexible sigmoidoscopy.  She had patchy changes.  It was felt she  had infectious etiology.  She also had a small tubular adenoma removed from  her sigmoid colon.  Now she comes back with left-sided abdominal pain and  fatigue, as well as irregular bowel movements.  She has not had rectal  bleeding or bloody diarrhea like she had before.  Since she had a small  adenoma found  on her sigmoidoscopy, we felt that complete exam is  warranted.  We did do a CT on her, which shows complete resolution of  changes of colitis that were seen on prior CT six months ago.  The  procedures risks were reviewed the patient, informed consent was obtained.   PREMEDICATION:  Demerol 30 mg IV Versed 7 mg IV in divided dose.   FINDINGS:  Procedure performed in endoscopy suite.  The patient's vital  signs and O2 sat were monitored during procedure and remained stable.  The  patient was placed left lateral position and rectal examination performed.  No abnormality noted on external or digital exam.  The Olympus video scope  was placed in the rectum, where mucosa was noted to be normal.  The scope  was advanced in sigmoid colon and beyond.  Preparation was excellent.  The  cecum was identified by appendiceal orifice and ileocecal valve.  Pictures  taken for the record.  The TI was also examined for about 15 cm and revealed  normal mucosa.  Pictures taken for the record.  As the scope was  withdrawn,  colonic mucosa was examined for the second time and revealed a normal  mucosal pattern throughout.  Rectal mucosa similarly was normal.  The scope  was retroflexed to examine the anorectal junction, which was unremarkable.  Endoscope was straightened and withdrawn.  The patient tolerated the  procedure well.   FINAL DIAGNOSIS:  Normal terminal ileoscopy and normal colonoscopy.  Colitis  has completely resolved since previous flexible sigmoidoscopy of August  2005.   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   RECOMMENDATIONS:  1.  High-fiber diet.  2.  Citrucel one tablespoonful.  3.  Levbid half to one tablet p.o. q.a.m.  4.  If she gets constipated, she can start Colace two tablets at bedtime.  5.  She will return for OV in two months from now.      NR/MEDQ  D:  02/04/2005  T:  02/04/2005  Job:  [id number]   cc:   Kingsley Callander. Ouida Sills, MD  [phone]  [place]  [place] [address]  Fax: [phone]

## 2011-05-13 NOTE — H&P (Signed)
NAME:  Amy Goodman, HATLESTAD NO.:  [id number]   MEDICAL RECORD NO.:  [id number]                   PATIENT TYPE:  INP   LOCATION:  2861                                 FACILITY:  [facility]   PHYSICIAN:  Hilda Lias, M.D.                DATE OF BIRTH:  16-Jan-1961   DATE OF ADMISSION:  12/02/2002  DATE OF DISCHARGE:                                HISTORY & PHYSICAL   The patient is a lady who was seen by me in September of this year  complaining of a three-month history of neck pain with radiation to the left  shoulder with tingling sensation going to the left arm which was getting  worse lately.  At that time, she was quite miserable.  She was having some  numbness in the arterial part of the left thigh.  She had an MRI of the  brain which was negative and MRI of the cervical spine which showed  herniated disk at the level of C6-C7 and 5-6.  The patient decided to go  ahead with surgery on December 18, but over the weekend she showed up in the  emergency room because of worsening pain with numbness in the left arm.  I  did my examination and she was sent home with medication and was scheduled  for surgery today.  She is quite miserable.  She wants to proceed with  surgery.   PAST MEDICAL HISTORY:  Negative.   ALLERGIES:  PENICILLIN.   CURRENT MEDICATIONS:  Zoloft and Imitrex.   SOCIAL HISTORY:  The patient does not smoke.  She drinks socially.  She is 5  feet 3 inches, 140 pounds.   FAMILY HISTORY:  There is heart disease in her family.   REVIEW OF SYSTEMS:  Positive for some weakness in the legs and leg pain and  left leg numbness and weakness.   PHYSICAL EXAMINATION:  HEENT:  Normal.  NECK:  She is able to flex but extension and lateralization is less than 5  degrees.  She has some tenderness in the trapezius as well as the  sternocleidomastoid muscle.  LUNGS: Clear.  HEART:  Sounds normal.  ABDOMEN:  Normal.  EXTREMITIES:  Normal pulses.  NEUROLOGIC:  Mental status normal.  Cranial nerves normal.  Strength is  normal except that I can break the left triceps.  In the lower extremity, I  can find weakness.  Also she has tendency to give out with right testing of  quadriceps.  Sensation: She complains of numbness in the left side of the  face and also some numbness which involves the L2, L3 nerves in the left  side of the leg and the left arm.  She has numbness which includes mostly  the thumb, index, and little finger.  Reflexes 1+.  No Babinski.  Coordination normal.   LABORATORY DATA:  The MRI showed that she has a herniated disk at  the level  of C6-C7 central and to the left as well as at the level of 5-6.   CLINICAL IMPRESSION:  1. C5-C6 herniated disk and C6-C7 herniated disk centrally and both of them     to the left.  2. Back pain with no new neurological findings.  3. The patient had a nerve conduction test which was negative for any carpal     tunnel syndrome.   RECOMMENDATIONS:  The patient will be admitted for two-level anterior  cervical diskectomy.  The surgery was fully explained to her and her husband  and sister.  The procedure will be a 5-6, 6-7 diskectomy, bone graft, and  plate.  The risks such as infection, CSF leak, no improvement with surgery,  worsening of pain, need for further surgery, damage to vocal cord were  discussed.                                               Hilda Lias, M.D.    EB/MEDQ  D:  12/02/2002  T:  12/02/2002  Job:  [id number]

## 2011-05-13 NOTE — H&P (Signed)
NAME:  Amy Goodman, Amy Goodman                        ACCOUNT NO.:  [id number]   MEDICAL RECORD NO.:  [id number]                   PATIENT TYPE:  INP   LOCATION:  A307                                 FACILITY:  [facility]   PHYSICIAN:  Kingsley Callander. Ouida Sills, M.D.                  DATE OF BIRTH:  [birth year]   DATE OF ADMISSION:  08/16/2004  DATE OF DISCHARGE:                                HISTORY & PHYSICAL   CHIEF COMPLAINT:  Diarrhea.   HISTORY OF PRESENT ILLNESS:  This patient is a 50 year old white female who  presented to the emergency room with abdominal cramps, diarrhea, and  vomiting for two days.  She had been seen in the emergency room one day  prior to admission and had been diagnosed with colitis based on a CT scan.  Her white count had been 10.8.  She had been treated with Cipro and Flagyl.  Due to increased cramping and associated vomiting and diarrhea, she came  back to the emergency room where she is subsequently admitted for additional  treatment and evaluation.  She denied hematemesis.  Her stools had been  dark, but she had not seen any blood in the stool.  She had not been anemic.   PAST MEDICAL HISTORY:  Cervical diskectomy.   MEDICATIONS:  1. Seasonale.  2. Cipro.  3. Flagyl.  4. Phenergan p.r.n.   ALLERGIES:  PENICILLIN.   SOCIAL HISTORY:  She does not smoke.  She does not use drugs.  She does not  abuse alcohol.   REVIEW OF SYSTEMS:  She denies dysuria.  She had been found to have some red  cells, white cells, and epithelial cells on urinalysis.  She denied flank  pain.  She is presently menstruating.   PHYSICAL EXAMINATION:  VITAL SIGNS:  Temperature 97.7, blood pressure  102/63, pulse 96, respirations 18, pulse oximetry 100.  GENERAL:  Ill-appearing young white female.  HEENT:  No scleral icterus.  Oropharynx appears dry.  NECK:  Supple, without JVD or thyromegaly.  LUNGS:  Clear.  HEART:  Regular with no murmurs.  ABDOMEN:  Tender in the midabdomen but  nondistended, with no palpable  hepatosplenomegaly or mass.  EXTREMITIES:  Normal pulses.  No clubbing or edema.  NEUROLOGIC:  Grossly intact.  LYMPH NODES:  No enlargement.   LABORATORY DATA:  On 08/15/2004, white count 10.8, hemoglobin 13.1, platelets  138.  Sodium 135, potassium 3.6, glucose 95, BUN 6, creatinine 0.8, SGOT 17,  albumin 3.2.  Urinalysis revealed many epithelial cells, 11-20 white cells,  7-10 red cells, many bacteria.   IMPRESSION AND PLAN:  1. Probable infectious colitis.  Will obtain stool studies for white cells,     culture and sensitivity, Clostridium difficile toxin (she has not     received antibiotics in the past three months), and     Giardia/Cryptosporidium.  A gastroenterology consultation may be     necessary  for consideration of colonoscopy.  Will repeat a complete blood     count and obtain a sedimentation rate.  Will discuss her case with     gastroenterology prior to continuing antibiotic therapy at this point.  2. Abnormal urinalysis.  This is likely related to her menses and has been     partially treated with Cipro.     ___________________________________________                                         Kingsley Callander. Ouida Sills, M.D.   ROF/MEDQ  D:  08/17/2004  T:  08/17/2004  Job:  [id number]

## 2011-05-13 NOTE — Op Note (Signed)
NAME:  Amy Goodman, Amy Goodman                        ACCOUNT NO.:  [id number]   MEDICAL RECORD NO.:  [id number]                   PATIENT TYPE:  INP   LOCATION:  A307                                 FACILITY:  [facility]   PHYSICIAN:  Lionel December, M.D.                 DATE OF BIRTH:  May 22, 1961   DATE OF PROCEDURE:  08/18/2004  DATE OF DISCHARGE:                                 OPERATIVE REPORT   PROCEDURE:  Flexible sigmoidoscopy.   INDICATIONS:  [name] is a 50 year old Caucasian female who presents with  acute onset of nausea, vomiting, abdominal cramps, and bloody diarrhea.  Stool studies are negative. She is undergoing diagnostic flexible  sigmoidoscopy. Procedure risks were reviewed with the patient and informed  consent was obtained.   PREOPERATIVE MEDICATIONS:  Demerol 25 mg IV, Versed 4 mg IV in divided  doses.   FINDINGS:  Procedure performed in endoscopy suite. The patient's vital signs  and O2 saturations were monitored during procedure and remained stable. The  patient was placed in the left lateral position and rectal examination  performed. No abnormality noted on external or digital exam. Olympus video  scope was placed in the rectum and advanced to the sigmoid colon. There,  mucosa was noted to be abnormal with patchy erythema, granularity, and loss  of vascularity. Similar changes were noted in the sigmoid colon but more  pronounced. Scope was passed to 45 cm which was felt to be junction of  sigmoid and descending colon. There appeared to be more pronounced changes  in the proximal sigmoid colon with erythema, friability, and erosions.  Biopsy was taken from the sigmoid colon mucosa. There was a small polyp at  the mid sigmoid colon which was biopsied for histology. During this process,  most of this polyp was removed. Endoscope was withdrawn. The patient  tolerated the procedure well.   FINAL DIAGNOSES:  1. Acute colitis involving rectum and sigmoid colon with patchy  distribution     suggestive of infectious process. Endoscopic findings not consistent with     ulcerative colitis.  2. Small polyp at sigmoid colon which was biopsied for histology.   RECOMMENDATIONS:  1. Will add metronidazole 250 mg p.o. t.i.d.  2. She will continue Cipro as before.  3. Other recommendations will be made.  4. Continue Cipro as before.      ___________________________________________                                            Lionel December, M.D.   NR/MEDQ  D:  08/18/2004  T:  08/19/2004  Job:  [id number]

## 2011-05-13 NOTE — Consult Note (Signed)
NAME:  Amy Goodman, Amy Goodman                        ACCOUNT NO.:  [id number]   MEDICAL RECORD NO.:  [id number]                   PATIENT TYPE:  INP   LOCATION:  A307                                 FACILITY:  [facility]   PHYSICIAN:  Lionel December, M.D.                 DATE OF BIRTH:  08-22-1961   DATE OF CONSULTATION:  08/17/2004  DATE OF DISCHARGE:                                   CONSULTATION   REASON FOR CONSULTATION:  Colitis.   HISTORY OF PRESENT ILLNESS:  Amy Goodman is a 50 year old Caucasian female who  was admitted to Dr. Alonza Smoker service yesterday via the emergency room, where  she presented with lower abdominal cramps, diarrhea, nausea and vomiting.  She was in her usual state of health until a few days ago when she just did  not feel well and noted a generalized achy feeling but did not have any  fever.  One day prior to admission she developed pain across her lower  abdomen followed by non-bloody diarrhea.  She had multiple bowel movements.  Her diarrhea continued during the night.  Yesterday morning she came to the  emergency room.  She was seen by Dr. Greta Doom.  She had an abdominal  pelvic CT, which showed thickening to the colon.  She was felt to have  colitis.  She was given a single dose of Cipro IV and discharged on Cipro  and Flagyl.  She possibly took a dose.  In the evening she began to have  nausea and vomiting followed by diarrhea.  She was not able to keep anything  down.  She felt very weak.  She, therefore, came back to the emergency room  and she was hospitalized.  She has been treated with IV fluids.  Stool  studies have been already done and these are pending.  She has not had any  vomiting today.  She actually feels hungry.  Since this afternoon she has  noted blood with her bowel movements.  She denies a recent weight loss,  antibiotic use, or travel abroad.  There is no history of skin rash, sore  throat, headache, or joint pains.   USUAL MEDICATIONS:  1.  Seasonale, which is an oral contraceptive.  2. She was given prescriptions for Cipro, Flagyl, and Phenergan yesterday     following her initial visit to the ER.   PAST MEDICAL HISTORY:  1. She has a history of IBS, but lately had done well.  2. She had a colonoscopy a few years ago, which reportedly was normal.  3. She had surgery on her left foot about 7 or 8 years ago.  4. Two years ago she had neck surgery for disk problems at 2 levels.  She     had a bone graft and had some screws and plating.   ALLERGIES:  None known.   FAMILY HISTORY:  Father died of brain cancer at age  65.  One maternal aunt  died of ovarian carcinoma in her 68s.  The family history on her mother's  side is positive for coronary artery disease.  She has 6 siblings in good  health.   SOCIAL HISTORY:  She is married.  She works in Clinical biochemist.  She has 2  step-children.  She does not smoke cigarettes.  She drinks alcohol  intermittently but not every week.   PHYSICAL EXAMINATION:  GENERAL:  A pleasant, well-developed well-nourished  Caucasian female who is in no acute distress.  VITAL SIGNS:  She weighs 142.6 pounds.  She is 63-1/2 inches tall.  Pulse  73, blood pressure 91/58, respiratory rate 20, and temperature is 97.1.  HEENT:  Conjunctivae is pink.  Sclerae is nonicteric.  Oral pharyngeal  mucosa is normal.  NECK:  Without masses or thyromegaly.  CARDIAC:  Regular rhythm.  Normal S1 and S3.  No murmur or gallop noted.  LUNGS:  Clear to auscultation.  ABDOMEN:  Symmetrical.  Bowel sounds are hyperactive.  Palpation reveals  mild to moderate tenderness at LLQ.  Less tenderness is noted in the left  upper quadrant and right lower quadrant.  No organomegaly or masses noted.  RECTAL:  Deferred.  EXTREMITIES:  No clubbing or edema noted.   LABORATORY DATA:  On admission WBC 7.7 from this morning, H&H 12.1 and 34.8,  platelet count is 124,000, two days ago it was 138,000.  Differential is  normal.   Sodium 141, potassium 3.4, chloride 111, CO2 is 26, BUN 6,  creatinine 0.9, glucose is 94.  Bilirubin 0.2, alkaline phosphatase 27, AST  17, ALT 13, total protein 5.4 with an albumin of 2.6, calcium is 7.9.  Her  stool specimen reveals multiple neutrophils.  Other studies have been  submitted and are pending.   An abdominal and pelvic CT is reviewed.  She had thickening to the colonic  wall, which is more or less diffuse.   ASSESSMENT:  1. Amy Goodman is a 50 year old Caucasian female who presents with more or less     acute onset of lower abdominal cramps, diarrhea which was initially     nonbloody and now is bloody.  She does not appear to be toxic.  She did     have __________ prodrome.  She has computed tomographic evidence of     diffuse colitis.  I suspect we are dealing with infectious colitis or     inflammatory bowel disease can also present in a similar fashion.  She     does __________ toxic.  2. Mild thrombocytopenia.   RECOMMENDATIONS:  1. We will continue Cipro at 500 mg p.o. b.i.d., which was started in the     emergency room empirically.  2. Levsin/SL t.i.d. p.r.n.  3. We will advance her diet to full liquids.  4. Unless stool studies are positive, we will consider a flexible     sigmoidoscopy in the a.m.   I would like to thank Dr. Ouida Sills for the opportunity to participate in the  care of this nice lady.      ___________________________________________                                            Lionel December, M.D.   NR/MEDQ  D:  08/17/2004  T:  08/17/2004  Job:  [id number]

## 2011-05-13 NOTE — Op Note (Signed)
NAME:  Amy Goodman, Amy Goodman                        ACCOUNT NO.:  [id number]   MEDICAL RECORD NO.:  [id number]                   PATIENT TYPE:  INP   LOCATION:  3112                                 FACILITY:  [facility]   PHYSICIAN:  Hilda Lias, M.D.                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   PROCEDURE:  Anterior C5-6, C6-7 diskectomy, decompression of the spinal  cord, bone bank graft, plate, microscope.   SURGEON:  Hilda Lias, M.D.   CLINICAL HISTORY:  The patient was admitted to the hospital because of back  and left arm pain.  The patient was seen in the emergency room on Friday  night because of worsening of pain.  Surgery was advised.  She was scheduled  to have surgery on December 02, 2002.   DESCRIPTION OF PROCEDURE:  The patient was taken to the OR and after  intubation, the left side of the neck was prepped with Betadine.  A  transverse incision was made all the way down to the cervical spine.  X-rays  showed that indeed we were at the level of C6-7.  From then on we identified  5-6 and 6-7.  There was a large osteophytes, which was removed.  We entered  the disk space and using the microscope, we did a total gross diskectomy.  Foraminotomy was accomplished with removal of some disk mostly going to the  left side.  The end plates were drilled at this level.  Then at the level of  C6-7 we found the same finding with spondylosis and a disk going to the left  side.  Two pieces of bone graft of 7 mm height were inserted.  We have a  good decompression of both C6 and C7 nerve roots.  After we have the bone  inserted, we put a plate and we had to change  it one time because it was a  little too long.  Then after we have the right plate, we introduce five  screws.  Lateral C-spine showed good position of the bone graft and the  plate.  From then on the area was investigated.  The esophagus, trachea, and  the carotid were normal.  Irrigation was done.  Then the wound was closed  with Vicryl and a Steri-Strip.  After the procedure, the patient was  extubated and when they took the orogastric tube, she vomited a bit of fresh  blood.  We kept her in the OR.  The wound in the neck was supple and there  was no initial evidence of hematoma.  Because of that,  we checked an NG  tube, and we had some bit of fresh blood.  Nevertheless, the patient was  stable and she was sent to the recovery room.  After that, I talked to the  family and they told me that this lady has a long history of bleeding  problems.  She had been seen by Ladona Horns. Neijstrom, M.D., the hematologist,  and all the workup had been negative.  Nevertheless, what I am going to do  is keep the NG tube and do a CBC tonight and tomorrow morning, and keep her  in the intensive care unit.  I will be calling Dr. Mariel Sleet tomorrow or the  gastroenterologist if there is any problem with her bleeding tonight.  At  the present time she is stable.                                              Hilda Lias, M.D.     EB/MEDQ  D:  12/02/2002  T:  12/03/2002  Job:  [id number]

## 2011-08-17 MED ORDER — SODIUM CHLORIDE 0.45 % IV SOLN
Freq: Once | INTRAVENOUS | Status: AC
  Administered 2011-08-18: 1000 mL via INTRAVENOUS

## 2011-08-18 ENCOUNTER — Encounter (HOSPITAL_COMMUNITY)
Admission: RE | Disposition: A | Discharge status: Home / Self Care | Payer: Self-pay | Source: Ambulatory Visit | Attending: Internal Medicine

## 2011-08-18 ENCOUNTER — Encounter (INDEPENDENT_AMBULATORY_CARE_PROVIDER_SITE_OTHER): Payer: 59 | Admitting: Internal Medicine

## 2011-08-18 ENCOUNTER — Ambulatory Visit (HOSPITAL_COMMUNITY)
Admission: RE | Admit: 2011-08-18 | Discharge: 2011-08-18 | Disposition: A | Discharge status: Home / Self Care | Payer: 59 | Source: Ambulatory Visit | Attending: Internal Medicine | Admitting: Internal Medicine

## 2011-08-18 ENCOUNTER — Other Ambulatory Visit (INDEPENDENT_AMBULATORY_CARE_PROVIDER_SITE_OTHER): Payer: Self-pay | Admitting: Internal Medicine

## 2011-08-18 ENCOUNTER — Encounter (HOSPITAL_COMMUNITY): Payer: Self-pay | Admitting: *Deleted

## 2011-08-18 DIAGNOSIS — K644 Residual hemorrhoidal skin tags: Secondary | ICD-10-CM | POA: Insufficient documentation

## 2011-08-18 DIAGNOSIS — Z860100 Personal history of colon polyps, unspecified: Secondary | ICD-10-CM | POA: Insufficient documentation

## 2011-08-18 DIAGNOSIS — D126 Benign neoplasm of colon, unspecified: Secondary | ICD-10-CM

## 2011-08-18 HISTORY — PX: COLONOSCOPY: SHX5424

## 2011-08-18 HISTORY — DX: Dermatopolymyositis, unspecified, organ involvement unspecified: M33.90

## 2011-08-18 HISTORY — DX: Other dermatomyositis without myopathy: M33.13

## 2011-08-18 SURGERY — COLONOSCOPY
Anesthesia: Moderate Sedation

## 2011-08-18 MED ORDER — MEPERIDINE HCL 50 MG/ML IJ SOLN
INTRAMUSCULAR | Status: DC | PRN
  Administered 2011-08-18: 20 mg via INTRAVENOUS
  Administered 2011-08-18: 15 mg via INTRAVENOUS

## 2011-08-18 MED ORDER — MIDAZOLAM HCL 5 MG/5ML IJ SOLN
INTRAMUSCULAR | Status: DC | PRN
  Administered 2011-08-18 (×3): 1 mg via INTRAVENOUS
  Administered 2011-08-18 (×2): 2 mg via INTRAVENOUS

## 2011-08-18 MED ORDER — STERILE WATER FOR IRRIGATION IR SOLN
Status: DC | PRN
  Administered 2011-08-18: 09:00:00

## 2011-08-18 MED ORDER — SODIUM CHLORIDE 0.9 % IJ SOLN
INTRAMUSCULAR | Status: DC | PRN
  Administered 2011-08-18: 5 mL

## 2011-08-18 NOTE — Op Note (Signed)
COLONOSCOPY PROCEDURE REPORT  PATIENT:  Amy Goodman  MR#:  [id number] Birthdate:  Feb 02, 1961, 50 y.o., female Endoscopist:  Dr. Malissa Hippo, MD Referred By:  Dr. Corrie Mckusick, MD Procedure Date: 08/18/2011  Procedure:   Colonoscopy with polypectomy.  Indications:  History of colonic adenoma moved on sigmoidoscopy of August 2005; last  colonoscopy was in February 2006.  Informed Consent: Procedure and risks were reviewed with the patient and informed consent was obtained. Medications:  Demerol 35  mg IV Versed 7  mg IV  Description of procedure:  After a digital rectal exam was performed, that colonoscope was advanced from the anus through the rectum and colon to the area of the cecum, ileocecal valve and appendiceal orifice. The cecum was deeply intubated. These structures were well-seen and photographed for the record. From the level of the cecum and ileocecal valve, the scope was slowly and cautiously withdrawn. The mucosal surfaces were carefully surveyed utilizing scope tip to flexion to facilitate fold flattening as needed. The scope was pulled down into the rectum where a thorough exam including retroflexion was performed.  Findings:   Prep excellent. Normal terminal ileum. 4 mm polyp ablated via cold biopsy from ascending colon. 10 mm sessile polyp located at proximal transverse colon. Saline-assisted polypectomy performed. Polyp retrieved for histologic examination. Small external hemorrhoids.  Therapeutic/Diagnostic Maneuvers Performed:  See above  Complications:  None  Cecal Withdrawal Time:  11 minutes  Impression:  Normal terminal ileum. 4 mm polyp ablated via cold biopsy from  ascending colon. 10 mm sessile polyp at proximal transverse colon; saline assist polypectomy performed. Small external hemorrhoids.  Recommendations:  No aspirin for 10 days. Physician will l contact you with biopsy results.  Korene Dula U  08/18/2011 9:21 AM  CC: Dr.  Colette Ribas, MD & Dr. Bonnetta Barry ref. provider found

## 2011-08-18 NOTE — H&P (Signed)
Amy Goodman is an 50 y.o. female.   Chief Complaint: Patient is here for colonoscopy. HPI: Patient is 50 year old Caucasian female with a colonic adenoma removed 7 years ago she is here for surveillance colonoscopy. Her bowels are regular. She denies abdominal pain melena or rectal bleeding. She has a good appetite and is maintaining her weight. Her dermatomyositis remains in remission. She walks 2 miles every day. Family history is negative for colorectal carcinoma.  Past Medical History  Diagnosis Date  . Dermatomyositis     Past Surgical History  Procedure Date  . Neck surgery   . Abdominal hysterectomy   . Bunionectomy     History reviewed. No pertinent family history. Social History:  reports that she has never smoked. She does not have any smokeless tobacco history on file. She reports that she drinks alcohol. Her drug history not on file.  Allergies:  Allergies  Allergen Reactions  . Amoxicillin Hives  . Penicillins Hives    Medications Prior to Admission  Medication Dose Route Frequency Provider Last Rate Last Dose  . 0.45 % sodium chloride infusion   Intravenous Once Malissa Hippo, MD 20 mL/hr at 08/18/11 0819 1,000 mL at 08/18/11 0819   Medications Prior to Admission  Medication Sig Dispense Refill  . azaTHIOprine (IMURAN) 50 MG tablet Take 50 mg by mouth daily.        Marland Kitchen escitalopram (LEXAPRO) 10 MG tablet Take 10 mg by mouth daily.        . hydroxychloroquine (PLAQUENIL) 200 MG tablet Take 200 mg by mouth daily.          No results found for this or any previous visit (from the past 48 hour(s)). No results found.  Review of Systems  Constitutional: Negative for weight loss.  Gastrointestinal: Negative for abdominal pain, diarrhea, constipation, blood in stool and melena.    Blood pressure 111/67, pulse 62, temperature 97.8 F (36.6 C), temperature source Oral, resp. rate 18, height 5\' 3"  (1.6 m), weight 152 lb (68.947 kg), SpO2 97.00%. Physical Exam    Constitutional: She appears well-developed and well-nourished.  HENT:  Mouth/Throat: Oropharynx is clear and moist.  Eyes: Conjunctivae are normal. No scleral icterus.  Neck: No thyromegaly present.  Cardiovascular: Normal rate, regular rhythm and normal heart sounds.   No murmur heard. Respiratory: Breath sounds normal.  GI: Soft. She exhibits no distension and no mass. There is no tenderness.  Musculoskeletal: She exhibits no edema.  Lymphadenopathy:    She has no cervical adenopathy.  Neurological: She is alert.  Skin: Skin is warm and dry.     Assessment/Plan History of colonic adenoma. Patient is here for colonoscopy  Alee Gressman U 08/18/2011, 8:34 AM

## 2011-08-22 ENCOUNTER — Encounter (INDEPENDENT_AMBULATORY_CARE_PROVIDER_SITE_OTHER): Payer: Self-pay | Admitting: *Deleted

## 2011-08-24 ENCOUNTER — Encounter (HOSPITAL_COMMUNITY): Payer: Self-pay | Admitting: Internal Medicine

## 2012-06-07 ENCOUNTER — Other Ambulatory Visit: Payer: Self-pay | Admitting: Occupational Medicine

## 2012-06-07 ENCOUNTER — Ambulatory Visit: Payer: Self-pay

## 2012-06-07 DIAGNOSIS — Z021 Encounter for pre-employment examination: Secondary | ICD-10-CM

## 2013-05-13 ENCOUNTER — Other Ambulatory Visit (HOSPITAL_COMMUNITY): Payer: Self-pay | Admitting: Family Medicine

## 2013-05-13 DIAGNOSIS — Z139 Encounter for screening, unspecified: Secondary | ICD-10-CM

## 2013-05-27 ENCOUNTER — Ambulatory Visit (HOSPITAL_COMMUNITY)
Admission: RE | Admit: 2013-05-27 | Discharge: 2013-05-27 | Disposition: A | Discharge status: Home / Self Care | Payer: Managed Care, Other (non HMO) | Source: Ambulatory Visit | Attending: Family Medicine | Admitting: Family Medicine

## 2013-05-27 DIAGNOSIS — Z139 Encounter for screening, unspecified: Secondary | ICD-10-CM

## 2013-05-27 DIAGNOSIS — Z1231 Encounter for screening mammogram for malignant neoplasm of breast: Secondary | ICD-10-CM | POA: Insufficient documentation

## 2014-04-25 ENCOUNTER — Other Ambulatory Visit (HOSPITAL_COMMUNITY): Payer: Self-pay | Admitting: Family Medicine

## 2014-04-25 DIAGNOSIS — Z1231 Encounter for screening mammogram for malignant neoplasm of breast: Secondary | ICD-10-CM

## 2014-05-02 ENCOUNTER — Ambulatory Visit (HOSPITAL_COMMUNITY)
Admission: RE | Admit: 2014-05-02 | Discharge: 2014-05-02 | Disposition: A | Discharge status: Home / Self Care | Payer: Managed Care, Other (non HMO) | Source: Ambulatory Visit | Attending: Family Medicine | Admitting: Family Medicine

## 2014-05-02 ENCOUNTER — Other Ambulatory Visit (HOSPITAL_COMMUNITY): Payer: Self-pay | Admitting: Family Medicine

## 2014-05-02 DIAGNOSIS — J209 Acute bronchitis, unspecified: Secondary | ICD-10-CM

## 2014-05-02 DIAGNOSIS — J40 Bronchitis, not specified as acute or chronic: Secondary | ICD-10-CM | POA: Insufficient documentation

## 2014-06-02 ENCOUNTER — Ambulatory Visit (HOSPITAL_COMMUNITY)
Admission: RE | Admit: 2014-06-02 | Discharge: 2014-06-02 | Disposition: A | Discharge status: Home / Self Care | Payer: Managed Care, Other (non HMO) | Source: Ambulatory Visit | Attending: Family Medicine | Admitting: Family Medicine

## 2014-06-02 DIAGNOSIS — Z1231 Encounter for screening mammogram for malignant neoplasm of breast: Secondary | ICD-10-CM | POA: Insufficient documentation

## 2015-06-26 ENCOUNTER — Inpatient Hospital Stay (HOSPITAL_COMMUNITY): Admission: RE | Admit: 2015-06-26 | Payer: Managed Care, Other (non HMO) | Source: Ambulatory Visit

## 2015-06-30 ENCOUNTER — Other Ambulatory Visit: Payer: Self-pay

## 2015-06-30 ENCOUNTER — Encounter (HOSPITAL_COMMUNITY)
Admission: RE | Admit: 2015-06-30 | Discharge: 2015-06-30 | Disposition: A | Discharge status: Home / Self Care | Payer: Managed Care, Other (non HMO) | Source: Ambulatory Visit | Attending: Ophthalmology | Admitting: Ophthalmology

## 2015-06-30 ENCOUNTER — Encounter (HOSPITAL_COMMUNITY): Payer: Self-pay

## 2015-06-30 DIAGNOSIS — H2512 Age-related nuclear cataract, left eye: Secondary | ICD-10-CM | POA: Diagnosis not present

## 2015-06-30 DIAGNOSIS — Z01818 Encounter for other preprocedural examination: Secondary | ICD-10-CM | POA: Insufficient documentation

## 2015-06-30 HISTORY — DX: Headache: R51

## 2015-06-30 HISTORY — DX: Headache, unspecified: R51.9

## 2015-06-30 LAB — BASIC METABOLIC PANEL
Anion gap: 7 (ref 5–15)
BUN: 14 mg/dL (ref 6–20)
CHLORIDE: 106 mmol/L (ref 101–111)
CO2: 29 mmol/L (ref 22–32)
Calcium: 9.7 mg/dL (ref 8.9–10.3)
Creatinine, Ser: 0.65 mg/dL (ref 0.44–1.00)
GFR calc Af Amer: >60 mL/min (ref >60)
GFR calc non Af Amer: >60 mL/min (ref >60)
GLUCOSE: 104 mg/dL — AB (ref 65–99)
POTASSIUM: 3.9 mmol/L (ref 3.5–5.1)
Sodium: 142 mmol/L (ref 135–145)

## 2015-06-30 LAB — CBC WITH DIFFERENTIAL/PLATELET
Basophils Absolute: 0 10*3/uL (ref 0.0–0.1)
Basophils Relative: 0 % (ref 0–1)
EOS ABS: 0.1 10*3/uL (ref 0.0–0.7)
EOS PCT: 1 % (ref 0–5)
HCT: 37.4 % (ref 36.0–46.0)
Hemoglobin: 12.5 g/dL (ref 12.0–15.0)
LYMPHS ABS: 0.6 10*3/uL — AB (ref 0.7–4.0)
Lymphocytes Relative: 17 % (ref 12–46)
MCH: 31.9 pg (ref 26.0–34.0)
MCHC: 33.4 g/dL (ref 30.0–36.0)
MCV: 95.4 fL (ref 78.0–100.0)
Monocytes Absolute: 0.3 10*3/uL (ref 0.1–1.0)
Monocytes Relative: 8 % (ref 3–12)
Neutro Abs: 2.7 10*3/uL (ref 1.7–7.7)
Neutrophils Relative %: 74 % (ref 43–77)
PLATELETS: 220 10*3/uL (ref 150–400)
RBC: 3.92 MIL/uL (ref 3.87–5.11)
RDW: 13.1 % (ref 11.5–15.5)
WBC: 3.7 10*3/uL — ABNORMAL LOW (ref 4.0–10.5)

## 2015-06-30 NOTE — Pre-Procedure Instructions (Signed)
Patient given information to sign up for my chart at home. 

## 2015-06-30 NOTE — Patient Instructions (Signed)
Your procedure is scheduled on: 07/06/2015  Report to [facility] at   1000   AM.  Call this number if you have problems the morning of surgery: [phone]   Do not eat food or drink liquids :After Midnight.      Take these medicines the morning of surgery with A SIP OF WATER: imitrex   Do not wear jewelry, make-up or nail polish.  Do not wear lotions, powders, or perfumes.   Do not shave 48 hours prior to surgery.  Do not bring valuables to the hospital.  Contacts, dentures or bridgework may not be worn into surgery.  Leave suitcase in the car. After surgery it may be brought to your room.  For patients admitted to the hospital, checkout time is 11:00 AM the day of discharge.   Patients discharged the day of surgery will not be allowed to drive home.  :     Please read over the following fact sheets that you were given: Coughing and Deep Breathing, Surgical Site Infection Prevention, Anesthesia Post-op Instructions and Care and Recovery After Surgery    Cataract A cataract is a clouding of the lens of the eye. When a lens becomes cloudy, vision is reduced based on the degree and nature of the clouding. Many cataracts reduce vision to some degree. Some cataracts make people more near-sighted as they develop. Other cataracts increase glare. Cataracts that are ignored and become worse can sometimes look white. The white color can be seen through the pupil. CAUSES   Aging. However, cataracts may occur at any age, even in newborns.   Certain drugs.   Trauma to the eye.   Certain diseases such as diabetes.   Specific eye diseases such as chronic inflammation inside the eye or a sudden attack of a rare form of glaucoma.   Inherited or acquired medical problems.  SYMPTOMS   Gradual, progressive drop in vision in the affected eye.   Severe, rapid visual loss. This most often happens when trauma is the cause.  DIAGNOSIS  To detect a cataract, an eye doctor examines the lens. Cataracts  are best diagnosed with an exam of the eyes with the pupils enlarged (dilated) by drops.  TREATMENT  For an early cataract, vision may improve by using different eyeglasses or stronger lighting. If that does not help your vision, surgery is the only effective treatment. A cataract needs to be surgically removed when vision loss interferes with your everyday activities, such as driving, reading, or watching TV. A cataract may also have to be removed if it prevents examination or treatment of another eye problem. Surgery removes the cloudy lens and usually replaces it with a substitute lens (intraocular lens, IOL).  At a time when both you and your doctor agree, the cataract will be surgically removed. If you have cataracts in both eyes, only one is usually removed at a time. This allows the operated eye to heal and be out of danger from any possible problems after surgery (such as infection or poor wound healing). In rare cases, a cataract may be doing damage to your eye. In these cases, your caregiver may advise surgical removal right away. The vast majority of people who have cataract surgery have better vision afterward. HOME CARE INSTRUCTIONS  If you are not planning surgery, you may be asked to do the following:  Use different eyeglasses.   Use stronger or brighter lighting.   Ask your eye doctor about reducing your medicine dose or changing  medicines if it is thought that a medicine caused your cataract. Changing medicines does not make the cataract go away on its own.   Become familiar with your surroundings. Poor vision can lead to injury. Avoid bumping into things on the affected side. You are at a higher risk for tripping or falling.   Exercise extreme care when driving or operating machinery.   Wear sunglasses if you are sensitive to bright light or experiencing problems with glare.  SEEK IMMEDIATE MEDICAL CARE IF:   You have a worsening or sudden vision loss.   You notice redness,  swelling, or increasing pain in the eye.   You have a fever.  Document Released: 12/12/2005 Document Revised: 12/01/2011 Document Reviewed: 08/05/2011 [facility] Patient Information 2012 [place].PATIENT INSTRUCTIONS POST-ANESTHESIA  IMMEDIATELY FOLLOWING SURGERY:  Do not drive or operate machinery for the first twenty four hours after surgery.  Do not make any important decisions for twenty four hours after surgery or while taking narcotic pain medications or sedatives.  If you develop intractable nausea and vomiting or a severe headache please notify your doctor immediately.  FOLLOW-UP:  Please make an appointment with your surgeon as instructed. You do not need to follow up with anesthesia unless specifically instructed to do so.  WOUND CARE INSTRUCTIONS (if applicable):  Keep a dry clean dressing on the anesthesia/puncture wound site if there is drainage.  Once the wound has quit draining you may leave it open to air.  Generally you should leave the bandage intact for twenty four hours unless there is drainage.  If the epidural site drains for more than 36-48 hours please call the anesthesia department.  QUESTIONS?:  Please feel free to call your physician or the hospital operator if you have any questions, and they will be happy to assist you.

## 2015-07-03 MED ORDER — NEOMYCIN-POLYMYXIN-DEXAMETH 3.5-10000-0.1 OP SUSP
OPHTHALMIC | Status: AC
  Filled 2015-07-03: qty 5

## 2015-07-03 MED ORDER — CYCLOPENTOLATE-PHENYLEPHRINE OP SOLN OPTIME - NO CHARGE
OPHTHALMIC | Status: AC
  Filled 2015-07-03: qty 2

## 2015-07-03 MED ORDER — LIDOCAINE HCL 3.5 % OP GEL
OPHTHALMIC | Status: AC
  Filled 2015-07-03: qty 1

## 2015-07-03 MED ORDER — TETRACAINE HCL 0.5 % OP SOLN
OPHTHALMIC | Status: AC
  Filled 2015-07-03: qty 2

## 2015-07-03 MED ORDER — LIDOCAINE HCL (PF) 1 % IJ SOLN
INTRAMUSCULAR | Status: AC
  Filled 2015-07-03: qty 2

## 2015-07-03 MED ORDER — PHENYLEPHRINE HCL 2.5 % OP SOLN
OPHTHALMIC | Status: AC
  Filled 2015-07-03: qty 15

## 2015-07-06 ENCOUNTER — Ambulatory Visit (HOSPITAL_COMMUNITY): Payer: Managed Care, Other (non HMO) | Admitting: Anesthesiology

## 2015-07-06 ENCOUNTER — Ambulatory Visit (HOSPITAL_COMMUNITY)
Admission: RE | Admit: 2015-07-06 | Discharge: 2015-07-06 | Disposition: A | Discharge status: Home / Self Care | Payer: Managed Care, Other (non HMO) | Source: Ambulatory Visit | Attending: Ophthalmology | Admitting: Ophthalmology

## 2015-07-06 ENCOUNTER — Encounter (HOSPITAL_COMMUNITY): Payer: Self-pay | Admitting: *Deleted

## 2015-07-06 ENCOUNTER — Encounter (HOSPITAL_COMMUNITY)
Admission: RE | Disposition: A | Discharge status: Home / Self Care | Payer: Self-pay | Source: Ambulatory Visit | Attending: Ophthalmology

## 2015-07-06 DIAGNOSIS — I517 Cardiomegaly: Secondary | ICD-10-CM | POA: Diagnosis not present

## 2015-07-06 DIAGNOSIS — H269 Unspecified cataract: Secondary | ICD-10-CM | POA: Diagnosis present

## 2015-07-06 DIAGNOSIS — H25812 Combined forms of age-related cataract, left eye: Secondary | ICD-10-CM | POA: Insufficient documentation

## 2015-07-06 DIAGNOSIS — Z79899 Other long term (current) drug therapy: Secondary | ICD-10-CM | POA: Insufficient documentation

## 2015-07-06 DIAGNOSIS — H04123 Dry eye syndrome of bilateral lacrimal glands: Secondary | ICD-10-CM | POA: Insufficient documentation

## 2015-07-06 HISTORY — PX: CATARACT EXTRACTION W/PHACO: SHX586

## 2015-07-06 SURGERY — PHACOEMULSIFICATION, CATARACT, WITH IOL INSERTION
Anesthesia: Monitor Anesthesia Care | Site: Eye | Laterality: Left

## 2015-07-06 MED ORDER — LIDOCAINE HCL 3.5 % OP GEL
1.0000 "application " | Freq: Once | OPHTHALMIC | Status: AC
  Administered 2015-07-06: 1 via OPHTHALMIC

## 2015-07-06 MED ORDER — MIDAZOLAM HCL 2 MG/2ML IJ SOLN
INTRAMUSCULAR | Status: AC
  Filled 2015-07-06: qty 2

## 2015-07-06 MED ORDER — CYCLOPENTOLATE-PHENYLEPHRINE 0.2-1 % OP SOLN
1.0000 [drp] | OPHTHALMIC | Status: AC
  Administered 2015-07-06 (×3): 1 [drp] via OPHTHALMIC

## 2015-07-06 MED ORDER — FENTANYL CITRATE (PF) 100 MCG/2ML IJ SOLN
25.0000 ug | INTRAMUSCULAR | Status: AC
  Administered 2015-07-06 (×2): 25 ug via INTRAVENOUS

## 2015-07-06 MED ORDER — LACTATED RINGERS IV SOLN
INTRAVENOUS | Status: DC
  Administered 2015-07-06: 12:00:00 via INTRAVENOUS

## 2015-07-06 MED ORDER — NEOMYCIN-POLYMYXIN-DEXAMETH 3.5-10000-0.1 OP SUSP
OPHTHALMIC | Status: DC | PRN
  Administered 2015-07-06: 2 [drp] via OPHTHALMIC

## 2015-07-06 MED ORDER — PROVISC 10 MG/ML IO SOLN
INTRAOCULAR | Status: DC | PRN
  Administered 2015-07-06: 0.85 mL via INTRAOCULAR

## 2015-07-06 MED ORDER — POVIDONE-IODINE 5 % OP SOLN
OPHTHALMIC | Status: DC | PRN
  Administered 2015-07-06: 1 via OPHTHALMIC

## 2015-07-06 MED ORDER — EPINEPHRINE HCL 1 MG/ML IJ SOLN
INTRAMUSCULAR | Status: AC
  Filled 2015-07-06: qty 1

## 2015-07-06 MED ORDER — EPINEPHRINE HCL 1 MG/ML IJ SOLN
INTRAOCULAR | Status: DC | PRN
  Administered 2015-07-06: 12:00:00

## 2015-07-06 MED ORDER — TETRACAINE HCL 0.5 % OP SOLN
1.0000 [drp] | OPHTHALMIC | Status: AC
  Administered 2015-07-06 (×3): 1 [drp] via OPHTHALMIC

## 2015-07-06 MED ORDER — BSS IO SOLN
INTRAOCULAR | Status: DC | PRN
  Administered 2015-07-06: 15 mL via INTRAOCULAR

## 2015-07-06 MED ORDER — FENTANYL CITRATE (PF) 100 MCG/2ML IJ SOLN
INTRAMUSCULAR | Status: AC
  Filled 2015-07-06: qty 2

## 2015-07-06 MED ORDER — PHENYLEPHRINE HCL 2.5 % OP SOLN
1.0000 [drp] | OPHTHALMIC | Status: AC
  Administered 2015-07-06 (×3): 1 [drp] via OPHTHALMIC

## 2015-07-06 MED ORDER — MIDAZOLAM HCL 2 MG/2ML IJ SOLN
1.0000 mg | INTRAMUSCULAR | Status: DC | PRN
  Administered 2015-07-06: 2 mg via INTRAVENOUS

## 2015-07-06 MED ORDER — LIDOCAINE HCL (PF) 1 % IJ SOLN
INTRAMUSCULAR | Status: DC | PRN
  Administered 2015-07-06: .5 mL

## 2015-07-06 SURGICAL SUPPLY — 34 items
CAPSULAR TENSION RING-AMO (OPHTHALMIC RELATED) IMPLANT
CLOTH BEACON ORANGE TIMEOUT ST (SAFETY) ×2 IMPLANT
EYE SHIELD UNIVERSAL CLEAR (GAUZE/BANDAGES/DRESSINGS) ×2 IMPLANT
GLOVE BIO SURGEON STRL SZ 6.5 (GLOVE) IMPLANT
GLOVE BIO SURGEONS STRL SZ 6.5 (GLOVE)
GLOVE BIOGEL PI IND STRL 6.5 (GLOVE) IMPLANT
GLOVE BIOGEL PI IND STRL 7.0 (GLOVE) IMPLANT
GLOVE BIOGEL PI IND STRL 7.5 (GLOVE) IMPLANT
GLOVE BIOGEL PI INDICATOR 6.5 (GLOVE) ×2
GLOVE BIOGEL PI INDICATOR 7.0 (GLOVE) ×2
GLOVE BIOGEL PI INDICATOR 7.5 (GLOVE)
GLOVE ECLIPSE 6.5 STRL STRAW (GLOVE) IMPLANT
GLOVE ECLIPSE 7.0 STRL STRAW (GLOVE) IMPLANT
GLOVE ECLIPSE 7.5 STRL STRAW (GLOVE) IMPLANT
GLOVE EXAM NITRILE LRG STRL (GLOVE) IMPLANT
GLOVE EXAM NITRILE MD LF STRL (GLOVE) IMPLANT
GLOVE SKINSENSE NS SZ6.5 (GLOVE)
GLOVE SKINSENSE NS SZ7.0 (GLOVE)
GLOVE SKINSENSE STRL SZ6.5 (GLOVE) IMPLANT
GLOVE SKINSENSE STRL SZ7.0 (GLOVE) IMPLANT
KIT VITRECTOMY (OPHTHALMIC RELATED) IMPLANT
PAD ARMBOARD 7.5X6 YLW CONV (MISCELLANEOUS) ×2 IMPLANT
PROC W NO LENS (INTRAOCULAR LENS)
PROC W SPEC LENS (INTRAOCULAR LENS)
PROCESS W NO LENS (INTRAOCULAR LENS) IMPLANT
PROCESS W SPEC LENS (INTRAOCULAR LENS) IMPLANT
RETRACTOR IRIS SIGHTPATH (OPHTHALMIC RELATED) IMPLANT
RING MALYGIN (MISCELLANEOUS) IMPLANT
SIGHTPATH CAT PROC W REG LENS (Ophthalmic Related) ×3 IMPLANT
SYRINGE LUER LOK 1CC (MISCELLANEOUS) ×2 IMPLANT
TAPE SURG TRANSPARENT 2IN (GAUZE/BANDAGES/DRESSINGS) IMPLANT
TAPE TRANSPARENT 2IN (GAUZE/BANDAGES/DRESSINGS) ×2
VISCOELASTIC ADDITIONAL (OPHTHALMIC RELATED) IMPLANT
WATER STERILE IRR 250ML POUR (IV SOLUTION) ×2 IMPLANT

## 2015-07-06 NOTE — Anesthesia Postprocedure Evaluation (Signed)
  Anesthesia Post-op Note  Patient: Amy Goodman  Procedure(s) Performed: Procedure(s) with comments: CATARACT EXTRACTION PHACO AND INTRAOCULAR LENS PLACEMENT LEFT EYE (Left) - CDE:3.23  Patient Location: Short Stay  Anesthesia Type:MAC  Level of Consciousness: awake, alert  and oriented  Airway and Oxygen Therapy: Patient Spontanous Breathing  Post-op Pain: none  Post-op Assessment: Post-op Vital signs reviewed, Patient's Cardiovascular Status Stable, Respiratory Function Stable, Patent Airway and No signs of Nausea or vomiting              Post-op Vital Signs: Reviewed and stable  Last Vitals:  Filed Vitals:   07/06/15 1145  BP: 94/59  Pulse:   Temp:   Resp: 14    Complications: No apparent anesthesia complications

## 2015-07-06 NOTE — H&P (Signed)
I have reviewed the H&P, the patient was re-examined, and I have identified no interval changes in medical condition and plan of care since the history and physical of record  

## 2015-07-06 NOTE — Discharge Instructions (Signed)

## 2015-07-06 NOTE — Anesthesia Preprocedure Evaluation (Signed)
Anesthesia Evaluation  Patient identified by MRN, date of birth, ID band Patient awake    Reviewed: Allergy & Precautions, NPO status , Patient's Chart, lab work & pertinent test results  Airway Mallampati: II  TM Distance: >3 FB     Dental  (+) Teeth Intact   Pulmonary neg pulmonary ROS,  breath sounds clear to auscultation        Cardiovascular negative cardio ROS  Rhythm:Regular     Neuro/Psych  Headaches,  Neuromuscular disease    GI/Hepatic negative GI ROS,   Endo/Other    Renal/GU      Musculoskeletal   Abdominal   Peds  Hematology   Anesthesia Other Findings   Reproductive/Obstetrics                             Anesthesia Physical Anesthesia Plan  ASA: II  Anesthesia Plan: MAC   Post-op Pain Management:    Induction: Intravenous  Airway Management Planned: Nasal Cannula  Additional Equipment:   Intra-op Plan:   Post-operative Plan:   Informed Consent: I have reviewed the patients History and Physical, chart, labs and discussed the procedure including the risks, benefits and alternatives for the proposed anesthesia with the patient or authorized representative who has indicated his/her understanding and acceptance.     Plan Discussed with:   Anesthesia Plan Comments:         Anesthesia Quick Evaluation

## 2015-07-06 NOTE — Transfer of Care (Signed)
Immediate Anesthesia Transfer of Care Note  Patient: Amy Goodman  Procedure(s) Performed: Procedure(s) with comments: CATARACT EXTRACTION PHACO AND INTRAOCULAR LENS PLACEMENT LEFT EYE (Left) - CDE:3.23  Patient Location: Short Stay  Anesthesia Type:MAC  Level of Consciousness: awake, alert  and oriented  Airway & Oxygen Therapy: Patient Spontanous Breathing  Post-op Assessment: Report given to RN  Post vital signs: Reviewed  Last Vitals:  Filed Vitals:   07/06/15 1145  BP: 94/59  Pulse:   Temp:   Resp: 14    Complications: No apparent anesthesia complications

## 2015-07-06 NOTE — Op Note (Signed)
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  Assistants: None  Anesthesia: Topical with MAC  Indications: Painless, progressive loss of vision with compromise of daily activities.  Surgery: Cataract Extraction with Intraocular lens Implant Left Eye  Discription: The patient had dilating drops and viscous lidocaine placed into the Left eye in the pre-op holding area. After transfer to the operating room, a time out was performed. The patient was then prepped and draped. Beginning with a 72 degree blade a paracentesis port was made at the surgeon's 2 o'clock position. The anterior chamber was then filled with 1% non-preserved lidocaine. This was followed by filling the anterior chamber with Provisc.  A 2.67mm keratome blade was used to make a clear corneal incision at the temporal limbus.  A bent cystatome needle was used to create a continuous tear capsulotomy. Hydrodissection was performed with balanced salt solution on a Fine canula. The lens nucleus was then removed using the phacoemulsification handpiece. Residual cortex was removed with the I&A handpiece. The anterior chamber and capsular bag were refilled with Provisc. A posterior chamber intraocular lens was placed into the capsular bag with it's injector. The implant was positioned with the Kuglan hook. The Provisc was then removed from the anterior chamber and capsular bag with the I&A handpiece. Stromal hydration of the main incision and paracentesis port was performed with BSS on a Fine canula. The wounds were tested for leak which was negative. The patient tolerated the procedure well. There were no operative complications. The patient was then transferred to the recovery room in stable condition.  Complications: None  Specimen: None  EBL: None  Prosthetic device: Hoya iSert 250, power 22.5 D, SN  [id number].

## 2015-07-07 ENCOUNTER — Encounter (HOSPITAL_COMMUNITY): Payer: Self-pay | Admitting: Ophthalmology

## 2015-07-20 ENCOUNTER — Other Ambulatory Visit: Payer: Self-pay

## 2015-07-20 DIAGNOSIS — Z1231 Encounter for screening mammogram for malignant neoplasm of breast: Secondary | ICD-10-CM

## 2015-08-10 ENCOUNTER — Encounter (HOSPITAL_COMMUNITY)
Admission: RE | Admit: 2015-08-10 | Discharge: 2015-08-10 | Disposition: A | Discharge status: Home / Self Care | Payer: Managed Care, Other (non HMO) | Source: Ambulatory Visit | Attending: Ophthalmology | Admitting: Ophthalmology

## 2015-08-14 MED ORDER — NEOMYCIN-POLYMYXIN-DEXAMETH 3.5-10000-0.1 OP SUSP
OPHTHALMIC | Status: AC
  Filled 2015-08-14: qty 5

## 2015-08-14 MED ORDER — LIDOCAINE HCL (PF) 1 % IJ SOLN
INTRAMUSCULAR | Status: AC
  Filled 2015-08-14: qty 2

## 2015-08-14 MED ORDER — CYCLOPENTOLATE-PHENYLEPHRINE OP SOLN OPTIME - NO CHARGE
OPHTHALMIC | Status: AC
  Filled 2015-08-14: qty 2

## 2015-08-14 MED ORDER — TETRACAINE HCL 0.5 % OP SOLN
OPHTHALMIC | Status: AC
  Filled 2015-08-14: qty 2

## 2015-08-14 MED ORDER — LIDOCAINE HCL 3.5 % OP GEL
OPHTHALMIC | Status: AC
  Filled 2015-08-14: qty 1

## 2015-08-14 MED ORDER — PHENYLEPHRINE HCL 2.5 % OP SOLN
OPHTHALMIC | Status: AC
  Filled 2015-08-14: qty 15

## 2015-08-17 ENCOUNTER — Ambulatory Visit (HOSPITAL_COMMUNITY): Payer: Managed Care, Other (non HMO) | Admitting: Anesthesiology

## 2015-08-17 ENCOUNTER — Encounter (HOSPITAL_COMMUNITY): Payer: Self-pay | Admitting: *Deleted

## 2015-08-17 ENCOUNTER — Ambulatory Visit (HOSPITAL_COMMUNITY)
Admission: RE | Admit: 2015-08-17 | Discharge: 2015-08-17 | Disposition: A | Discharge status: Home / Self Care | Payer: Managed Care, Other (non HMO) | Source: Ambulatory Visit | Attending: Ophthalmology | Admitting: Ophthalmology

## 2015-08-17 ENCOUNTER — Encounter (HOSPITAL_COMMUNITY)
Admission: RE | Disposition: A | Discharge status: Home / Self Care | Payer: Self-pay | Source: Ambulatory Visit | Attending: Ophthalmology

## 2015-08-17 DIAGNOSIS — H2511 Age-related nuclear cataract, right eye: Secondary | ICD-10-CM | POA: Insufficient documentation

## 2015-08-17 HISTORY — PX: CATARACT EXTRACTION W/PHACO: SHX586

## 2015-08-17 SURGERY — PHACOEMULSIFICATION, CATARACT, WITH IOL INSERTION
Anesthesia: Monitor Anesthesia Care | Site: Eye | Laterality: Right

## 2015-08-17 MED ORDER — PHENYLEPHRINE HCL 2.5 % OP SOLN
1.0000 [drp] | OPHTHALMIC | Status: AC
  Administered 2015-08-17 (×3): 1 [drp] via OPHTHALMIC

## 2015-08-17 MED ORDER — BSS IO SOLN
INTRAOCULAR | Status: DC | PRN
  Administered 2015-08-17: 15 mL

## 2015-08-17 MED ORDER — EPINEPHRINE HCL 1 MG/ML IJ SOLN
INTRAMUSCULAR | Status: AC
  Filled 2015-08-17: qty 1

## 2015-08-17 MED ORDER — CYCLOPENTOLATE-PHENYLEPHRINE 0.2-1 % OP SOLN
1.0000 [drp] | OPHTHALMIC | Status: AC
  Administered 2015-08-17 (×3): 1 [drp] via OPHTHALMIC

## 2015-08-17 MED ORDER — FENTANYL CITRATE (PF) 100 MCG/2ML IJ SOLN
INTRAMUSCULAR | Status: AC
  Filled 2015-08-17: qty 2

## 2015-08-17 MED ORDER — MIDAZOLAM HCL 2 MG/2ML IJ SOLN
INTRAMUSCULAR | Status: AC
  Filled 2015-08-17: qty 2

## 2015-08-17 MED ORDER — TETRACAINE HCL 0.5 % OP SOLN
1.0000 [drp] | OPHTHALMIC | Status: AC
  Administered 2015-08-17 (×3): 1 [drp] via OPHTHALMIC

## 2015-08-17 MED ORDER — LACTATED RINGERS IV SOLN
INTRAVENOUS | Status: DC
  Administered 2015-08-17: 10:00:00 via INTRAVENOUS

## 2015-08-17 MED ORDER — FENTANYL CITRATE (PF) 100 MCG/2ML IJ SOLN
25.0000 ug | INTRAMUSCULAR | Status: AC
  Administered 2015-08-17 (×2): 25 ug via INTRAVENOUS

## 2015-08-17 MED ORDER — POVIDONE-IODINE 5 % OP SOLN
OPHTHALMIC | Status: DC | PRN
  Administered 2015-08-17: 1 via OPHTHALMIC

## 2015-08-17 MED ORDER — MIDAZOLAM HCL 2 MG/2ML IJ SOLN
1.0000 mg | INTRAMUSCULAR | Status: DC | PRN
  Administered 2015-08-17: 2 mg via INTRAVENOUS

## 2015-08-17 MED ORDER — PROVISC 10 MG/ML IO SOLN
INTRAOCULAR | Status: DC | PRN
  Administered 2015-08-17: 0.85 mL via INTRAOCULAR

## 2015-08-17 MED ORDER — LIDOCAINE HCL (PF) 1 % IJ SOLN
INTRAMUSCULAR | Status: DC | PRN
  Administered 2015-08-17: .5 mL

## 2015-08-17 MED ORDER — BSS IO SOLN
INTRAOCULAR | Status: DC | PRN
  Administered 2015-08-17: 500 mL

## 2015-08-17 MED ORDER — NEOMYCIN-POLYMYXIN-DEXAMETH 3.5-10000-0.1 OP SUSP
OPHTHALMIC | Status: DC | PRN
  Administered 2015-08-17: 2 [drp] via OPHTHALMIC

## 2015-08-17 MED ORDER — LIDOCAINE HCL 3.5 % OP GEL
1.0000 "application " | Freq: Once | OPHTHALMIC | Status: AC
  Administered 2015-08-17: 1 via OPHTHALMIC

## 2015-08-17 MED ORDER — LIDOCAINE 3.5 % OP GEL OPTIME - NO CHARGE
OPHTHALMIC | Status: DC | PRN
  Administered 2015-08-17: 1 [drp] via OPHTHALMIC

## 2015-08-17 SURGICAL SUPPLY — 11 items
CLOTH BEACON ORANGE TIMEOUT ST (SAFETY) ×2 IMPLANT
EYE SHIELD UNIVERSAL CLEAR (GAUZE/BANDAGES/DRESSINGS) ×2 IMPLANT
GLOVE BIOGEL PI IND STRL 6.5 (GLOVE) IMPLANT
GLOVE BIOGEL PI INDICATOR 6.5 (GLOVE) ×2
GLOVE EXAM NITRILE MD LF STRL (GLOVE) ×2 IMPLANT
PAD ARMBOARD 7.5X6 YLW CONV (MISCELLANEOUS) ×2 IMPLANT
SIGHTPATH CAT PROC W REG LENS (Ophthalmic Related) ×3 IMPLANT
SYRINGE LUER LOK 1CC (MISCELLANEOUS) ×2 IMPLANT
TAPE SURG TRANSPORE 1 IN (GAUZE/BANDAGES/DRESSINGS) IMPLANT
TAPE SURGICAL TRANSPORE 1 IN (GAUZE/BANDAGES/DRESSINGS) ×2
WATER STERILE IRR 250ML POUR (IV SOLUTION) ×2 IMPLANT

## 2015-08-17 NOTE — Op Note (Signed)
Date of Admission: 08/17/2015  Date of Surgery: 08/17/2015   Pre-Op Dx: Cataract Right Eye  Post-Op Dx: Senile Nuclear Cataract Right  Eye,  Dx Code H25.11  Surgeon: Tonny Branch, M.D.  Assistants: None  Anesthesia: Topical with MAC  Indications: Painless, progressive loss of vision with compromise of daily activities.  Surgery: Cataract Extraction with Intraocular lens Implant Right Eye  Discription: The patient had dilating drops and viscous lidocaine placed into the Right eye in the pre-op holding area. After transfer to the operating room, a time out was performed. The patient was then prepped and draped. Beginning with a 59 degree blade a paracentesis port was made at the surgeon's 2 o'clock position. The anterior chamber was then filled with 1% non-preserved lidocaine. This was followed by filling the anterior chamber with Provisc.  A 2.35mm keratome blade was used to make a clear corneal incision at the temporal limbus.  A bent cystatome needle was used to create a continuous tear capsulotomy. Hydrodissection was performed with balanced salt solution on a Fine canula. The lens nucleus was then removed using the phacoemulsification handpiece. Residual cortex was removed with the I&A handpiece. The anterior chamber and capsular bag were refilled with Provisc. A posterior chamber intraocular lens was placed into the capsular bag with it's injector. The implant was positioned with the Kuglan hook. The Provisc was then removed from the anterior chamber and capsular bag with the I&A handpiece. Stromal hydration of the main incision and paracentesis port was performed with BSS on a Fine canula. The wounds were tested for leak which was negative. The patient tolerated the procedure well. There were no operative complications. The patient was then transferred to the recovery room in stable condition.  Complications: None  Specimen: None  EBL: None  Prosthetic device: Hoya iSert 250, power 22.5 D,  SN [id number].

## 2015-08-17 NOTE — Anesthesia Preprocedure Evaluation (Signed)
Anesthesia Evaluation  Patient identified by MRN, date of birth, ID band Patient awake    Reviewed: Allergy & Precautions, NPO status , Patient's Chart, lab work & pertinent test results  Airway Mallampati: II  TM Distance: >3 FB     Dental  (+) Teeth Intact   Pulmonary neg pulmonary ROS,  breath sounds clear to auscultation        Cardiovascular negative cardio ROS  Rhythm:Regular     Neuro/Psych  Headaches,  Neuromuscular disease    GI/Hepatic negative GI ROS,   Endo/Other    Renal/GU      Musculoskeletal   Abdominal   Peds  Hematology   Anesthesia Other Findings   Reproductive/Obstetrics                             Anesthesia Physical Anesthesia Plan  ASA: II  Anesthesia Plan: MAC   Post-op Pain Management:    Induction: Intravenous  Airway Management Planned: Nasal Cannula  Additional Equipment:   Intra-op Plan:   Post-operative Plan:   Informed Consent: I have reviewed the patients History and Physical, chart, labs and discussed the procedure including the risks, benefits and alternatives for the proposed anesthesia with the patient or authorized representative who has indicated his/her understanding and acceptance.     Plan Discussed with:   Anesthesia Plan Comments:         Anesthesia Quick Evaluation

## 2015-08-17 NOTE — Anesthesia Postprocedure Evaluation (Signed)
  Anesthesia Post-op Note  Patient: Amy Goodman  Procedure(s) Performed: Procedure(s): CATARACT EXTRACTION PHACO AND INTRAOCULAR LENS PLACEMENT RIGHT EYE CDE=3.44 (Right)  Patient Location: Short Stay  Anesthesia Type:MAC  Level of Consciousness: awake, alert , oriented and patient cooperative  Airway and Oxygen Therapy: Patient Spontanous Breathing  Post-op Pain: none  Post-op Assessment: Post-op Vital signs reviewed, Patient's Cardiovascular Status Stable, Respiratory Function Stable, Patent Airway, No signs of Nausea or vomiting and Pain level controlled              Post-op Vital Signs: Reviewed and stable  Last Vitals:  Filed Vitals:   08/17/15 1045  BP: 83/52  Pulse:   Temp:   Resp: 10    Complications: No apparent anesthesia complications

## 2015-08-17 NOTE — Discharge Instructions (Signed)

## 2015-08-17 NOTE — Transfer of Care (Signed)
Immediate Anesthesia Transfer of Care Note  Patient: Amy Goodman  Procedure(s) Performed: Procedure(s): CATARACT EXTRACTION PHACO AND INTRAOCULAR LENS PLACEMENT RIGHT EYE CDE=3.44 (Right)  Patient Location: Short Stay  Anesthesia Type:MAC  Level of Consciousness: awake, alert , oriented and patient cooperative  Airway & Oxygen Therapy: Patient Spontanous Breathing  Post-op Assessment: Report given to RN, Post -op Vital signs reviewed and stable and Patient moving all extremities  Post vital signs: Reviewed and stable  Last Vitals:  Filed Vitals:   08/17/15 1045  BP: 83/52  Pulse:   Temp:   Resp: 10    Complications: No apparent anesthesia complications

## 2015-08-17 NOTE — H&P (Signed)
I have reviewed the H&P, the patient was re-examined, and I have identified no interval changes in medical condition and plan of care since the history and physical of record  

## 2015-08-19 ENCOUNTER — Encounter (HOSPITAL_COMMUNITY): Payer: Self-pay | Admitting: Ophthalmology

## 2015-08-25 ENCOUNTER — Ambulatory Visit
Admission: RE | Admit: 2015-08-25 | Discharge: 2015-08-25 | Disposition: A | Discharge status: Home / Self Care | Payer: Managed Care, Other (non HMO) | Source: Ambulatory Visit

## 2015-08-25 DIAGNOSIS — Z1231 Encounter for screening mammogram for malignant neoplasm of breast: Secondary | ICD-10-CM

## 2016-07-21 ENCOUNTER — Encounter (INDEPENDENT_AMBULATORY_CARE_PROVIDER_SITE_OTHER): Payer: Self-pay | Admitting: *Deleted

## 2017-01-19 ENCOUNTER — Ambulatory Visit (INDEPENDENT_AMBULATORY_CARE_PROVIDER_SITE_OTHER): Payer: Managed Care, Other (non HMO) | Admitting: Otolaryngology

## 2017-01-19 DIAGNOSIS — J310 Chronic rhinitis: Secondary | ICD-10-CM | POA: Diagnosis not present

## 2017-01-19 DIAGNOSIS — J340 Abscess, furuncle and carbuncle of nose: Secondary | ICD-10-CM

## 2017-02-13 ENCOUNTER — Ambulatory Visit (INDEPENDENT_AMBULATORY_CARE_PROVIDER_SITE_OTHER): Payer: Managed Care, Other (non HMO) | Admitting: Otolaryngology

## 2017-03-21 ENCOUNTER — Other Ambulatory Visit (HOSPITAL_COMMUNITY): Payer: Self-pay | Admitting: Family Medicine

## 2017-03-27 ENCOUNTER — Other Ambulatory Visit (HOSPITAL_COMMUNITY): Payer: Self-pay | Admitting: Family Medicine

## 2017-03-27 DIAGNOSIS — N6320 Unspecified lump in the left breast, unspecified quadrant: Secondary | ICD-10-CM

## 2017-03-30 ENCOUNTER — Ambulatory Visit
Admission: RE | Admit: 2017-03-30 | Discharge: 2017-03-30 | Disposition: A | Discharge status: Home / Self Care | Payer: Managed Care, Other (non HMO) | Source: Ambulatory Visit | Attending: Family Medicine | Admitting: Family Medicine

## 2017-03-30 DIAGNOSIS — N6320 Unspecified lump in the left breast, unspecified quadrant: Secondary | ICD-10-CM

## 2017-04-11 ENCOUNTER — Encounter (HOSPITAL_COMMUNITY): Payer: Self-pay

## 2017-04-11 ENCOUNTER — Encounter (HOSPITAL_COMMUNITY): Payer: Managed Care, Other (non HMO)

## 2017-07-20 ENCOUNTER — Ambulatory Visit (INDEPENDENT_AMBULATORY_CARE_PROVIDER_SITE_OTHER): Payer: Managed Care, Other (non HMO) | Admitting: Otolaryngology

## 2018-01-24 ENCOUNTER — Emergency Department (HOSPITAL_COMMUNITY)
Admission: EM | Admit: 2018-01-24 | Discharge: 2018-01-24 | Disposition: A | Discharge status: Home / Self Care | Payer: 59 | Attending: Emergency Medicine | Admitting: Emergency Medicine

## 2018-01-24 ENCOUNTER — Encounter (HOSPITAL_COMMUNITY): Payer: Self-pay

## 2018-01-24 DIAGNOSIS — R51 Headache: Secondary | ICD-10-CM | POA: Insufficient documentation

## 2018-01-24 DIAGNOSIS — R110 Nausea: Secondary | ICD-10-CM | POA: Diagnosis not present

## 2018-01-24 DIAGNOSIS — M3393 Dermatopolymyositis, unspecified without myopathy: Secondary | ICD-10-CM | POA: Diagnosis not present

## 2018-01-24 DIAGNOSIS — R519 Headache, unspecified: Secondary | ICD-10-CM

## 2018-01-24 MED ORDER — DEXAMETHASONE SODIUM PHOSPHATE 4 MG/ML IJ SOLN
10.0000 mg | Freq: Once | INTRAMUSCULAR | Status: AC
  Administered 2018-01-24: 10 mg via INTRAVENOUS
  Filled 2018-01-24 (×2): qty 3

## 2018-01-24 MED ORDER — PROCHLORPERAZINE EDISYLATE 5 MG/ML IJ SOLN
10.0000 mg | Freq: Once | INTRAMUSCULAR | Status: AC
  Administered 2018-01-24: 10 mg via INTRAVENOUS
  Filled 2018-01-24: qty 2

## 2018-01-24 MED ORDER — SODIUM CHLORIDE 0.9 % IV BOLUS (SEPSIS)
1000.0000 mL | Freq: Once | INTRAVENOUS | Status: AC
  Administered 2018-01-24: 1000 mL via INTRAVENOUS

## 2018-01-24 MED ORDER — KETOROLAC TROMETHAMINE 30 MG/ML IJ SOLN
30.0000 mg | Freq: Once | INTRAMUSCULAR | Status: AC
  Administered 2018-01-24: 30 mg via INTRAVENOUS
  Filled 2018-01-24: qty 1

## 2018-01-24 MED ORDER — DIPHENHYDRAMINE HCL 50 MG/ML IJ SOLN
25.0000 mg | Freq: Once | INTRAMUSCULAR | Status: AC
  Administered 2018-01-24: 25 mg via INTRAVENOUS
  Filled 2018-01-24: qty 1

## 2018-01-24 NOTE — ED Triage Notes (Signed)
Pt reports has an autoimmune disease and received  infusions of privigen  Mon, tues, and wed this week at [facility].  Today she started c/o headache and says is a side effect of the medication.  Pt says she usually gets a headache after the infusion but this time reports chills, burning and swelling in face, and n/v.  Denies any difficulty breathing or swallowing.  Pt took imitrex for the headache around 1515.  At 1615 she called the pharmacist at [facility] and was instructed to take 2 extra strength and 25mg  benadryl.  Denies any relief.

## 2018-01-24 NOTE — Discharge Instructions (Signed)
Please take your medicines exactly as prescribed by your doctor including imitrex as needed Drink plenty of fluids Cool dark room tonight and tomorrow ER for itchy rash, swelling, fevers or worsening headache / vomiting / visual changes

## 2018-01-24 NOTE — ED Provider Notes (Signed)
[facility] EMERGENCY DEPARTMENT Provider Note   CSN: [id number] Arrival date & time: 01/24/18  1744     History   Chief Complaint Chief Complaint  Patient presents with  . Allergic Reaction    HPI Amy Goodman is a 57 y.o. female.  HPI   The patient is a 57 year old female, she presents today with a complaint of a headache along with some other symptoms as well.  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  Recently she had a flareup of some of the skin changes and has been under the treatment with IVIG starting on Monday, on Tuesday she had a second dose, today when she went back she noticed that the IV infiltrated in the right antecubital fossa.  They removed that IV and placed at another location.  Throughout the evening the patient has developed some headache, she has also developed some other symptoms including nausea, her arm hurts where the IV was.  She also complains of a burning sensation in her face despite no rash.  No difficulty with breathing, no sore throat, no urticarial or other rashes, no changes in vision.  She did take a Imitrex which she states usually helps her headaches when she takes these infusions but states it did not help.  She consulted with a pharmacist by phone who told her to go to a hospital to get evaluated for an allergic reaction.  The patient denies any swelling of her tongue, her lips, her throat and has had no wheezing or difficulty breathing.  She does report that she commonly gets a headache on the third day of infusion   Past Medical History:  Diagnosis Date  . Dermatomyositis ([place])    Takes IVIG monthly at home.  Marland Kitchen Headache     Patient Active Problem List   Diagnosis Date Noted  . BUNION 12/31/2008  . 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 FOOT  PAIN 11/19/2008    Past Surgical History:  Procedure Laterality Date  . ABDOMINAL HYSTERECTOMY    . BUNIONECTOMY Bilateral   . CATARACT EXTRACTION W/PHACO Left 07/06/2015   Procedure: CATARACT EXTRACTION PHACO AND INTRAOCULAR LENS PLACEMENT LEFT EYE;  Surgeon: Tonny Branch, MD;  Location: [facility];  Service: Ophthalmology;  Laterality: Left;  CDE:3.23  . CATARACT EXTRACTION W/PHACO Right 08/17/2015   Procedure: CATARACT EXTRACTION PHACO AND INTRAOCULAR LENS PLACEMENT RIGHT EYE CDE=3.44;  Surgeon: Tonny Branch, MD;  Location: [facility] ORS;  Service: Ophthalmology;  Laterality: Right;  . COLONOSCOPY  08/18/2011   Procedure: COLONOSCOPY;  Surgeon: Rogene Houston, MD;  Location: [facility] ENDO SUITE;  Service: Endoscopy;  Laterality: N/A;  . NECK SURGERY     cervical disc    OB History    No data available       Home Medications    Prior to Admission medications   Medication Sig Start Date End Date Taking? Authorizing Provider  acetaminophen (TYLENOL) 500 MG tablet Take 500 mg by mouth every 6 (six) hours as needed for headache.   Yes [provider]  escitalopram (LEXAPRO) 20 MG tablet Take 20 mg by mouth daily. 11/08/17  Yes [provider]  folic acid (FOLVITE) 1 MG tablet Take 1 mg by mouth. 12/05/17  Yes [provider]  Immune Globulin, Human, (PRIVIGEN IV) Inject into the vein as needed (for flares  dermatomyocitis).   Yes [provider]  methotrexate 2.5 MG tablet Take 6 tablets by mouth every Friday. 12/05/17  Yes [provider]  SUMAtriptan (IMITREX) 25 MG tablet Take 25 mg by mouth every 2 (two) hours as needed for migraine. May repeat in 2 hours if headache persists or recurs.   Yes [provider]  Vitamin D, Ergocalciferol, (DRISDOL) 50000 units CAPS capsule Take 1 capsule by mouth once a week. 11/07/17  Yes [provider]    Family History Family History  Problem Relation Age of Onset  . Breast cancer Mother   . Breast  cancer Paternal Aunt     Social History Social History   Tobacco Use  . Smoking status: Never Smoker  . Smokeless tobacco: Never Used  Substance Use Topics  . Alcohol use: No    Frequency: Never  . Drug use: No     Allergies   Amoxicillin and Penicillins   Review of Systems Review of Systems  All other systems reviewed and are negative.    Physical Exam Updated Vital Signs BP 133/77   Pulse 66   Temp 98 F (36.7 C) (Oral)   Resp 19   Ht 5\' 4"  (1.626 m)   Wt 72.6 kg (160 lb)   SpO2 96%   BMI 27.46 kg/m   Physical Exam  Constitutional: She appears well-developed and well-nourished. No distress.  HENT:  Head: Normocephalic and atraumatic.  Mouth/Throat: Oropharynx is clear and moist. No oropharyngeal exudate.  There is no swelling of the oral cavity or the pharynx, normal phonation  Eyes: Conjunctivae and EOM are normal. Pupils are equal, round, and reactive to light. Right eye exhibits no discharge. Left eye exhibits no discharge. No scleral icterus.  Neck: Normal range of motion. Neck supple. No JVD present. No thyromegaly present.  Cardiovascular: Normal rate, regular rhythm, normal heart sounds and intact distal pulses. Exam reveals no gallop and no friction rub.  No murmur heard. Pulmonary/Chest: Effort normal and breath sounds normal. No respiratory distress. She has no wheezes. She has no rales.  Abdominal: Soft. Bowel sounds are normal. She exhibits no distension and no mass. There is no tenderness.  Musculoskeletal: Normal range of motion. She exhibits tenderness. She exhibits no edema.  There is some tenderness surrounding the antecubital fossa on the right, there is no bruising, there is minimal swelling, there is no induration or warmth  Lymphadenopathy:    She has no cervical adenopathy.  Neurological: She is alert. Coordination normal.  The patient is able to move all 4 extremities with normal strength, normal coordination, normal speech  Skin: Skin  is warm and dry. No rash noted. No erythema.  There is no rashes in the skin of the face appears normal  Psychiatric: She has a normal mood and affect. Her behavior is normal.  Nursing note and vitals reviewed.    ED Treatments / Results  Labs (all labs ordered are listed, but only abnormal results are displayed) Labs Reviewed - No data to display  Radiology No results found.  Procedures Procedures (including critical care time)  Medications Ordered in ED Medications  ketorolac (TORADOL) 30 MG/ML injection 30 mg (30 mg Intravenous Given 01/24/18 2006)  prochlorperazine (COMPAZINE) injection 10 mg (10 mg Intravenous Given 01/24/18 2006)  diphenhydrAMINE (BENADRYL) injection 25 mg (25 mg Intravenous Given 01/24/18 2006)  sodium chloride 0.9 % bolus 1,000 mL (0 mLs Intravenous Stopped 01/24/18 2044)  dexamethasone (DECADRON) injection 10 mg (10 mg Intravenous Given 01/24/18  2006)     Initial Impression / Assessment and Plan / ED Course  I have reviewed the triage vital signs and the nursing notes.  Pertinent labs & imaging results that were available during my care of the patient were reviewed by me and considered in my medical decision making (see chart for details).     The cause of the patient's symptoms could be related to her third day headache as is per her norm however this could be a reaction to the medications though that seems less likely given no urticaria or signs of true allergy.  She has had this education multiple times in the past without any kind of manifestations other than a headache  Start with medications including Decadron Compazine Benadryl Toradol and some IV fluids.  The patient is in agreement with the plan  The patient has been evaluated, medicated, she states that she feels much better and would like to go home.  On repeat exam she still has a normal mental status, clear sensorium, appears to be in good spirits, she is aware of the indications for  return  Vitals:   01/24/18 1810 01/24/18 2016  BP: 129/75 133/77  Pulse: 66 66  Resp: 18 19  Temp: 98 F (36.7 C)   TempSrc: Oral   SpO2: 100% 96%  Weight: 72.6 kg (160 lb)   Height: 5\' 4"  (1.626 m)      Final Clinical Impressions(s) / ED Diagnoses   Final diagnoses:  Bad headache      Noemi Chapel, MD 01/24/18 2135

## 2018-02-22 ENCOUNTER — Other Ambulatory Visit (HOSPITAL_COMMUNITY): Payer: Self-pay | Admitting: Family Medicine

## 2018-02-22 ENCOUNTER — Other Ambulatory Visit: Payer: Self-pay | Admitting: Family Medicine

## 2018-02-22 DIAGNOSIS — Z1231 Encounter for screening mammogram for malignant neoplasm of breast: Secondary | ICD-10-CM

## 2018-04-02 ENCOUNTER — Ambulatory Visit: Payer: Managed Care, Other (non HMO)

## 2018-04-25 ENCOUNTER — Ambulatory Visit: Payer: Managed Care, Other (non HMO)

## 2018-08-16 ENCOUNTER — Ambulatory Visit
Admission: RE | Admit: 2018-08-16 | Discharge: 2018-08-16 | Disposition: A | Discharge status: Home / Self Care | Payer: 59 | Source: Ambulatory Visit | Attending: Family Medicine | Admitting: Family Medicine

## 2018-08-16 DIAGNOSIS — Z1231 Encounter for screening mammogram for malignant neoplasm of breast: Secondary | ICD-10-CM

## 2019-08-29 ENCOUNTER — Emergency Department (HOSPITAL_COMMUNITY)
Admission: EM | Admit: 2019-08-29 | Discharge: 2019-08-29 | Disposition: A | Discharge status: Home / Self Care | Payer: 59 | Attending: Emergency Medicine | Admitting: Emergency Medicine

## 2019-08-29 ENCOUNTER — Encounter (HOSPITAL_COMMUNITY): Payer: Self-pay

## 2019-08-29 ENCOUNTER — Other Ambulatory Visit: Payer: Self-pay

## 2019-08-29 DIAGNOSIS — R197 Diarrhea, unspecified: Secondary | ICD-10-CM | POA: Insufficient documentation

## 2019-08-29 DIAGNOSIS — R42 Dizziness and giddiness: Secondary | ICD-10-CM | POA: Diagnosis present

## 2019-08-29 DIAGNOSIS — Z79899 Other long term (current) drug therapy: Secondary | ICD-10-CM | POA: Insufficient documentation

## 2019-08-29 DIAGNOSIS — R112 Nausea with vomiting, unspecified: Secondary | ICD-10-CM | POA: Diagnosis not present

## 2019-08-29 LAB — CBC WITH DIFFERENTIAL/PLATELET
Abs Immature Granulocytes: 0.01 10*3/uL (ref 0.00–0.07)
Basophils Absolute: 0 10*3/uL (ref 0.0–0.1)
Basophils Relative: 0 %
Eosinophils Absolute: 0 10*3/uL (ref 0.0–0.5)
Eosinophils Relative: 0 %
HCT: 33.3 % — ABNORMAL LOW (ref 36.0–46.0)
Hemoglobin: 11.2 g/dL — ABNORMAL LOW (ref 12.0–15.0)
Immature Granulocytes: 0 %
Lymphocytes Relative: 7 %
Lymphs Abs: 0.4 10*3/uL — ABNORMAL LOW (ref 0.7–4.0)
MCH: 32.7 pg (ref 26.0–34.0)
MCHC: 33.6 g/dL (ref 30.0–36.0)
MCV: 97.4 fL (ref 80.0–100.0)
Monocytes Absolute: 0.2 10*3/uL (ref 0.1–1.0)
Monocytes Relative: 3 %
Neutro Abs: 4.7 10*3/uL (ref 1.7–7.7)
Neutrophils Relative %: 90 %
Platelets: 271 10*3/uL (ref 150–400)
RBC: 3.42 MIL/uL — ABNORMAL LOW (ref 3.87–5.11)
RDW: 14.7 % (ref 11.5–15.5)
WBC: 5.3 10*3/uL (ref 4.0–10.5)
nRBC: 0 % (ref 0.0–0.2)

## 2019-08-29 LAB — URINALYSIS, ROUTINE W REFLEX MICROSCOPIC
Bacteria, UA: NONE SEEN
Bilirubin Urine: NEGATIVE
Glucose, UA: NEGATIVE mg/dL
Hgb urine dipstick: NEGATIVE
Ketones, ur: NEGATIVE mg/dL
Nitrite: NEGATIVE
Protein, ur: NEGATIVE mg/dL
Specific Gravity, Urine: 1.012 (ref 1.005–1.030)
pH: 6 (ref 5.0–8.0)

## 2019-08-29 LAB — COMPREHENSIVE METABOLIC PANEL
ALT: 35 U/L (ref 0–44)
AST: 31 U/L (ref 15–41)
Albumin: 4 g/dL (ref 3.5–5.0)
Alkaline Phosphatase: 53 U/L (ref 38–126)
Anion gap: 7 (ref 5–15)
BUN: 13 mg/dL (ref 6–20)
CO2: 25 mmol/L (ref 22–32)
Calcium: 9.2 mg/dL (ref 8.9–10.3)
Chloride: 103 mmol/L (ref 98–111)
Creatinine, Ser: 0.67 mg/dL (ref 0.44–1.00)
GFR calc Af Amer: >60 mL/min (ref >60)
GFR calc non Af Amer: >60 mL/min (ref >60)
Glucose, Bld: 102 mg/dL — ABNORMAL HIGH (ref 70–99)
Potassium: 3.8 mmol/L (ref 3.5–5.1)
Sodium: 135 mmol/L (ref 135–145)
Total Bilirubin: 0.6 mg/dL (ref 0.3–1.2)
Total Protein: 8.5 g/dL — ABNORMAL HIGH (ref 6.5–8.1)

## 2019-08-29 MED ORDER — MECLIZINE HCL 25 MG PO TABS: 25.0000 mg | ORAL_TABLET | Freq: Three times a day (TID) | ORAL | 0 refills | Status: AC | PRN

## 2019-08-29 MED ORDER — ONDANSETRON HCL 4 MG/2ML IJ SOLN
4.0000 mg | Freq: Once | INTRAMUSCULAR | Status: AC
  Administered 2019-08-29: 17:00:00 4 mg via INTRAVENOUS
  Filled 2019-08-29: qty 2

## 2019-08-29 MED ORDER — SODIUM CHLORIDE 0.9 % IV BOLUS
1000.0000 mL | Freq: Once | INTRAVENOUS | Status: AC
  Administered 2019-08-29: 16:00:00 1000 mL via INTRAVENOUS

## 2019-08-29 MED ORDER — MECLIZINE HCL 12.5 MG PO TABS
25.0000 mg | ORAL_TABLET | Freq: Once | ORAL | Status: AC
  Administered 2019-08-29: 25 mg via ORAL
  Filled 2019-08-29: qty 2

## 2019-08-29 NOTE — Discharge Instructions (Signed)
You were seen in the emergency department for dizziness room spinning nausea and vomiting.  You had blood work urinalysis and received some IV fluids and some medications.  Your symptoms were improving.  We are prescribing you some meclizine to use at home.  Please keep well-hydrated.  Do not drive until your symptoms have resolved.  Please follow-up with your doctor and return to the emergency department for any worsening or concerning symptoms.

## 2019-08-29 NOTE — ED Triage Notes (Signed)
Pt reports woke up this morning and was "little bit" dizzy.  Reports diarrhea and nausea x 3 days.  Reports at approx 1215 pt started having blurred vision and dizziness got worse.  Pt ate lunch but  vomited today after lunch.  Pt says feels like room is spinning.

## 2019-08-29 NOTE — ED Provider Notes (Signed)
[facility] EMERGENCY DEPARTMENT Provider Note   CSN: [id number] Arrival date & time: 08/29/19  1520     History   Chief Complaint Chief Complaint  Patient presents with  . Emesis  . Dizziness    HPI Amy Goodman is a 58 y.o. female.  She has a history of dermatomyositis and is on IVIG and follows with [facility].  She is complaining of feeling off this morning.  She had diarrhea and some nausea for the last few days and then today on getting up she felt dizzy which she describes as room spinning and lightheadedness.  Symptoms of been on and off throughout the day and after eating lunch she had a lot of vomiting and had some blurry vision with worse dizziness.  She is never had these symptoms before.  Her dermatomyositis symptoms are usually muscle weakness.  No fever no sore throat no cough no headache no abdominal pain no chest pain no shortness of breath no urinary symptoms.  No rashes no focal weakness.     The history is provided by the patient.  Dizziness Quality:  Room spinning and lightheadedness Severity:  Severe Onset quality:  Gradual Duration:  8 hours Timing:  Intermittent Progression:  Unchanged Chronicity:  New Context: head movement and standing up   Context: not with loss of consciousness   Relieved by:  Nothing Worsened by:  Sitting upright and standing up Ineffective treatments:  Being still Associated symptoms: diarrhea, nausea, vision changes and vomiting   Associated symptoms: no blood in stool, no chest pain, no headaches, no hearing loss, no palpitations, no shortness of breath, no syncope and no weakness     Past Medical History:  Diagnosis Date  . Dermatomyositis ([place])    Takes IVIG monthly at home.  Marland Kitchen Headache     Patient Active Problem List   Diagnosis Date Noted  . BUNION 12/31/2008  . 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 FOOT PAIN 11/19/2008    Past Surgical History:  Procedure Laterality Date  . ABDOMINAL HYSTERECTOMY    . BUNIONECTOMY  Bilateral   . CATARACT EXTRACTION W/PHACO Left 07/06/2015   Procedure: CATARACT EXTRACTION PHACO AND INTRAOCULAR LENS PLACEMENT LEFT EYE;  Surgeon: Tonny Branch, MD;  Location: [facility] ORS;  Service: Ophthalmology;  Laterality: Left;  CDE:3.23  . CATARACT EXTRACTION W/PHACO Right 08/17/2015   Procedure: CATARACT EXTRACTION PHACO AND INTRAOCULAR LENS PLACEMENT RIGHT EYE CDE=3.44;  Surgeon: Tonny Branch, MD;  Location: [facility] OR[facility];  Service: Ophthalmology;  Laterality: Right;  . COLONOSCOPY  08/18/2011   Procedure: COLONOSCOPY;  Surgeon: Rogene Houston, MD;  Location: [facility] ENDO SUITE;  Service: Endoscopy;  Laterality: N/A;  . NECK SURGERY     cervical disc     OB History   No obstetric history on file.      Home Medications    Prior to Admission medications   Medication Sig Start Date End Date Taking? Authorizing Provider  acetaminophen (TYLENOL) 500 MG tablet Take 500 mg by mouth every 6 (six) hours as needed for headache.    [provider]  escitalopram (LEXAPRO) 20 MG tablet Take 20 mg by mouth daily. 11/08/17   [provider]  folic acid (FOLVITE) 1 MG tablet Take 1 mg by mouth. 12/05/17   [provider]  Immune Globulin, Human, (PRIVIGEN IV) Inject into the vein as needed (for flares dermatomyocitis).    [provider]  methotrexate 2.5 MG tablet Take 6 tablets by mouth every Friday. 12/05/17   [provider]  SUMAtriptan (IMITREX) 25 MG tablet Take 25 mg by mouth every 2 (two) hours as needed for migraine. May repeat in 2 hours if headache persists or recurs.    [provider]  Vitamin D, Ergocalciferol, (DRISDOL) 50000 units CAPS capsule Take 1 capsule by mouth once a week. 11/07/17   [provider]    Family History Family History  Problem Relation Age of Onset  . Breast cancer Mother   . Breast cancer Paternal Aunt     Social History Social History   Tobacco Use  . Smoking status: Never Smoker  . Smokeless tobacco:  Never Used  Substance Use Topics  . Alcohol use: No    Frequency: Never  . Drug use: No     Allergies   Amoxicillin and Penicillins   Review of Systems Review of Systems  Constitutional: Negative for fever.  HENT: Negative for hearing loss and sore throat.   Eyes: Positive for visual disturbance. Negative for pain.  Respiratory: Negative for shortness of breath.   Cardiovascular: Negative for chest pain, palpitations and syncope.  Gastrointestinal: Positive for diarrhea, nausea and vomiting. Negative for abdominal pain and blood in stool.  Genitourinary: Negative for dysuria.  Musculoskeletal: Negative for neck pain.  Skin: Negative for rash.  Neurological: Positive for dizziness. Negative for speech difficulty, weakness and headaches.     Physical Exam Updated Vital Signs BP 121/75 (BP Location: Left Arm)   Pulse 71   Temp 98.1 F (36.7 C) (Oral)   Resp 18   Ht 5\' 4"  (1.626 m)   Wt 72.6 kg   SpO2 99%   BMI 27.46 kg/m   Physical Exam Vitals signs and nursing note reviewed.  Constitutional:      General: She is not in acute distress.    Appearance: Normal appearance. She is well-developed.  HENT:     Head: Normocephalic and atraumatic.     Right Ear: Tympanic membrane normal.     Left Ear: Tympanic membrane normal.     Mouth/Throat:     Mouth: Mucous membranes are moist.     Pharynx: Oropharynx is clear.  Eyes:     Extraocular Movements: Extraocular movements intact.     Conjunctiva/sclera: Conjunctivae normal.     Pupils: Pupils are equal, round, and reactive to light.     Comments: Has a few beats of horizontal nystagmus.  Neck:     Musculoskeletal: Neck supple.  Cardiovascular:     Rate and Rhythm: Normal rate and regular rhythm.     Heart sounds: No murmur.  Pulmonary:     Effort: Pulmonary effort is normal. No respiratory distress.     Breath sounds: Normal breath sounds.  Abdominal:     Palpations: Abdomen is soft.     Tenderness: There is no  abdominal tenderness.  Musculoskeletal: Normal range of motion.     Right lower leg: No edema.     Left lower leg: No edema.  Skin:    General: Skin is warm and dry.     Capillary Refill: Capillary refill takes less than 2 seconds.  Neurological:     General: No focal deficit present.     Mental Status: She is alert and oriented to person, place, and time.     Cranial Nerves: No cranial nerve deficit.     Sensory: No sensory deficit.     Motor: No weakness.      ED Treatments / Results  Labs (all labs ordered are  listed, but only abnormal results are displayed) Labs Reviewed  CBC WITH DIFFERENTIAL/PLATELET - Abnormal; Notable for the following components:      Result Value   RBC 3.42 (*)    Hemoglobin 11.2 (*)    HCT 33.3 (*)    Lymphs Abs 0.4 (*)    All other components within normal limits  URINALYSIS, ROUTINE W REFLEX MICROSCOPIC - Abnormal; Notable for the following components:   Leukocytes,Ua SMALL (*)    All other components within normal limits  COMPREHENSIVE METABOLIC PANEL - Abnormal; Notable for the following components:   Glucose, Bld 102 (*)    Total Protein 8.5 (*)    All other components within normal limits    EKG EKG Interpretation  Date/Time:  Thursday August 29 2019 15:53:59 EDT Ventricular Rate:  62 PR Interval:    QRS Duration: 96 QT Interval:  468 QTC Calculation: 476 R Axis:   9 Text Interpretation:  Sinus rhythm RSR' in V1 or V2, probably normal variant similar to prior 7/16 Confirmed by Aletta Edouard [id number]) on 08/29/2019 4:02:07 PM   Radiology No results found.  Procedures Procedures (including critical care time)  Medications Ordered in ED Medications  sodium chloride 0.9 % bolus 1,000 mL (has no administration in time range)  ondansetron (ZOFRAN) injection 4 mg (has no administration in time range)  meclizine (ANTIVERT) tablet 25 mg (has no administration in time range)     Initial Impression / Assessment and Plan / ED  Course  I have reviewed the triage vital signs and the nursing notes.  Pertinent labs & imaging results that were available during my care of the patient were reviewed by me and considered in my medical decision making (see chart for details).  Clinical Course as of Aug 29 1046  Thu Sep 03, 308  5151 58 year old female with history of dermatomyositis here with nausea and diarrhea for a few days and now with dizziness room spinning and lightheadedness vomiting today.  She is nonfocal neuro exam.  Vitals are unremarkable.  Differential includes vertigo, viral syndrome, dehydration, metabolic derangement, arrhythmia   [MB]  1623 Patient's hemoglobin is down about a point and a half from her last 1 which was 4 years ago in her system.   [MB]  [id number] Reevaluated patient.  She says she is symptoms are improved and she was able to ambulate to the bathroom.  We reviewed her work-up and the likelihood that this is vertigo.  We talked about medications and if any worsening symptoms or any new neurologic symptoms she should return to the emergency department.  She and her husband are comfortable with this plan.   [MB]    Clinical Course User Index [MB] Hayden Rasmussen, MD       Final Clinical Impressions(s) / ED Diagnoses   Final diagnoses:  Dizziness  Non-intractable vomiting with nausea, unspecified vomiting type    ED Discharge Orders         Ordered    meclizine (ANTIVERT) 25 MG tablet  3 times daily PRN     08/29/19 1750           Hayden Rasmussen, MD 08/30/19 1047

## 2019-09-26 ENCOUNTER — Other Ambulatory Visit: Payer: Self-pay | Admitting: Family Medicine

## 2019-09-26 DIAGNOSIS — Z1231 Encounter for screening mammogram for malignant neoplasm of breast: Secondary | ICD-10-CM

## 2019-10-01 ENCOUNTER — Other Ambulatory Visit: Payer: Self-pay

## 2019-10-01 ENCOUNTER — Ambulatory Visit
Admission: RE | Admit: 2019-10-01 | Discharge: 2019-10-01 | Disposition: A | Discharge status: Home / Self Care | Payer: 59 | Source: Ambulatory Visit | Attending: Family Medicine | Admitting: Family Medicine

## 2019-10-01 DIAGNOSIS — Z1231 Encounter for screening mammogram for malignant neoplasm of breast: Secondary | ICD-10-CM

## 2020-02-19 ENCOUNTER — Ambulatory Visit: Payer: 59 | Attending: Internal Medicine

## 2020-02-19 ENCOUNTER — Other Ambulatory Visit: Payer: Self-pay

## 2020-02-19 DIAGNOSIS — Z20822 Contact with and (suspected) exposure to covid-19: Secondary | ICD-10-CM

## 2020-02-20 ENCOUNTER — Telehealth: Payer: Self-pay | Admitting: *Deleted

## 2020-02-20 LAB — NOVEL CORONAVIRUS, NAA: SARS-CoV-2, NAA: NOT DETECTED

## 2020-02-20 NOTE — Telephone Encounter (Signed)
Pt notified of negative COVID-19 results. Understanding verbalized.   

## 2020-02-27 ENCOUNTER — Ambulatory Visit: Payer: 59 | Attending: Internal Medicine

## 2020-02-27 DIAGNOSIS — Z23 Encounter for immunization: Secondary | ICD-10-CM | POA: Insufficient documentation

## 2020-02-27 NOTE — Progress Notes (Signed)
   Covid-19 Vaccination Clinic  Name:  Amy Goodman    MRN: [id number] DOB: Jan 07, 1961  02/27/2020  Amy Goodman was observed post Covid-19 immunization for 15 minutes without incident. She was provided with Vaccine Information Sheet and instruction to access the V-Safe system.   Amy Goodman was instructed to call 911 with any severe reactions post vaccine: Marland Kitchen Difficulty breathing  . Swelling of face and throat  . A fast heartbeat  . A bad rash all over body  . Dizziness and weakness   Immunizations Administered    Name Date Dose VIS Date Route   Moderna COVID-19 Vaccine 02/27/2020  8:55 AM 0.5 mL 11/26/2019 Intramuscular   Manufacturer: Moderna   Lot: [id number]   [id number]

## 2020-04-01 ENCOUNTER — Ambulatory Visit: Payer: 59 | Attending: Internal Medicine

## 2020-04-01 DIAGNOSIS — Z23 Encounter for immunization: Secondary | ICD-10-CM

## 2020-04-01 NOTE — Progress Notes (Signed)
   Covid-19 Vaccination Clinic  Name:  Amy Goodman    MRN: [id number] DOB: 1961-09-04  04/01/2020  [name] was observed post Covid-19 immunization for 15 minutes without incident. She was provided with Vaccine Information Sheet and instruction to access the V-Safe system. She left without being seen at the checkout observation table.  [name] was instructed to call 911 with any severe reactions post vaccine: Marland Kitchen Difficulty breathing  . Swelling of face and throat  . A fast heartbeat  . A bad rash all over body  . Dizziness and weakness   Immunizations Administered    Name Date Dose VIS Date Route   Moderna COVID-19 Vaccine 04/01/2020  8:04 AM 0.5 mL 11/26/2019 Intramuscular   Manufacturer: Levan Hurst   [id number]   [id number]

## 2020-06-30 ENCOUNTER — Emergency Department (HOSPITAL_COMMUNITY): Payer: 59

## 2020-06-30 ENCOUNTER — Encounter (HOSPITAL_COMMUNITY): Payer: Self-pay

## 2020-06-30 ENCOUNTER — Emergency Department (HOSPITAL_COMMUNITY)
Admission: EM | Admit: 2020-06-30 | Discharge: 2020-06-30 | Disposition: A | Discharge status: Home / Self Care | Payer: 59 | Attending: Emergency Medicine | Admitting: Emergency Medicine

## 2020-06-30 DIAGNOSIS — R0789 Other chest pain: Secondary | ICD-10-CM | POA: Diagnosis not present

## 2020-06-30 DIAGNOSIS — R079 Chest pain, unspecified: Secondary | ICD-10-CM | POA: Diagnosis not present

## 2020-06-30 DIAGNOSIS — Z5321 Procedure and treatment not carried out due to patient leaving prior to being seen by health care provider: Secondary | ICD-10-CM | POA: Diagnosis not present

## 2020-06-30 LAB — TROPONIN I (HIGH SENSITIVITY)
Troponin I (High Sensitivity): <2 ng/L (ref <18)
Troponin I (High Sensitivity): <2 ng/L (ref <18)

## 2020-06-30 LAB — BASIC METABOLIC PANEL
Anion gap: 9 (ref 5–15)
BUN: 13 mg/dL (ref 6–20)
CO2: 25 mmol/L (ref 22–32)
Calcium: 9.7 mg/dL (ref 8.9–10.3)
Chloride: 106 mmol/L (ref 98–111)
Creatinine, Ser: 0.7 mg/dL (ref 0.44–1.00)
GFR calc Af Amer: >60 mL/min (ref >60)
GFR calc non Af Amer: >60 mL/min (ref >60)
Glucose, Bld: 113 mg/dL — ABNORMAL HIGH (ref 70–99)
Potassium: 4.8 mmol/L (ref 3.5–5.1)
Sodium: 140 mmol/L (ref 135–145)

## 2020-06-30 LAB — CBC
HCT: 38.2 % (ref 36.0–46.0)
Hemoglobin: 12.6 g/dL (ref 12.0–15.0)
MCH: 31.7 pg (ref 26.0–34.0)
MCHC: 33 g/dL (ref 30.0–36.0)
MCV: 96.2 fL (ref 80.0–100.0)
Platelets: 202 10*3/uL (ref 150–400)
RBC: 3.97 MIL/uL (ref 3.87–5.11)
RDW: 14.1 % (ref 11.5–15.5)
WBC: 3.5 10*3/uL — ABNORMAL LOW (ref 4.0–10.5)
nRBC: 0 % (ref 0.0–0.2)

## 2020-06-30 LAB — I-STAT BETA HCG BLOOD, ED (MC, WL, AP ONLY): I-stat hCG, quantitative: <5 m[IU]/mL (ref <5)

## 2020-06-30 MED ORDER — SODIUM CHLORIDE 0.9% FLUSH: 3.0000 mL | Freq: Once | INTRAVENOUS | Status: DC

## 2020-06-30 NOTE — ED Notes (Signed)
Pt stated that she was feeling a little better and just wanted to go home and take some benadryl for he uticaria.  Pt advised to return if symptoms worsen.

## 2020-06-30 NOTE — ED Triage Notes (Signed)
Pt arrives to ED w/ c/o 6/10 chest heaviness and hives. Pt endorses sob. Pt denies n/v. Pt states she ate some cashews and tomatoes and then she broke out in hives.

## 2020-07-02 DIAGNOSIS — L509 Urticaria, unspecified: Secondary | ICD-10-CM | POA: Diagnosis not present

## 2020-07-02 DIAGNOSIS — M3390 Dermatopolymyositis, unspecified, organ involvement unspecified: Secondary | ICD-10-CM | POA: Diagnosis not present

## 2020-07-02 DIAGNOSIS — Z79899 Other long term (current) drug therapy: Secondary | ICD-10-CM | POA: Diagnosis not present

## 2020-07-03 ENCOUNTER — Other Ambulatory Visit (HOSPITAL_COMMUNITY): Payer: Self-pay | Admitting: Family Medicine

## 2020-07-03 DIAGNOSIS — Z13220 Encounter for screening for lipoid disorders: Secondary | ICD-10-CM | POA: Diagnosis not present

## 2020-07-03 DIAGNOSIS — E559 Vitamin D deficiency, unspecified: Secondary | ICD-10-CM | POA: Diagnosis not present

## 2020-07-03 DIAGNOSIS — Z6826 Body mass index (BMI) 26.0-26.9, adult: Secondary | ICD-10-CM | POA: Diagnosis not present

## 2020-07-03 DIAGNOSIS — F419 Anxiety disorder, unspecified: Secondary | ICD-10-CM | POA: Diagnosis not present

## 2020-07-03 DIAGNOSIS — G43109 Migraine with aura, not intractable, without status migrainosus: Secondary | ICD-10-CM | POA: Diagnosis not present

## 2020-07-03 DIAGNOSIS — Z1389 Encounter for screening for other disorder: Secondary | ICD-10-CM | POA: Diagnosis not present

## 2020-07-03 DIAGNOSIS — D473 Essential (hemorrhagic) thrombocythemia: Secondary | ICD-10-CM | POA: Diagnosis not present

## 2020-07-03 DIAGNOSIS — R945 Abnormal results of liver function studies: Secondary | ICD-10-CM | POA: Diagnosis not present

## 2020-07-03 DIAGNOSIS — M3390 Dermatopolymyositis, unspecified, organ involvement unspecified: Secondary | ICD-10-CM | POA: Diagnosis not present

## 2020-07-03 MED FILL — ESCITALOPRAM 20 MG TABLET: 20 | 90 days supply | Qty: 90 | Fill #0

## 2020-07-13 DIAGNOSIS — M3390 Dermatopolymyositis, unspecified, organ involvement unspecified: Secondary | ICD-10-CM | POA: Diagnosis not present

## 2020-07-28 DIAGNOSIS — M3390 Dermatopolymyositis, unspecified, organ involvement unspecified: Secondary | ICD-10-CM | POA: Diagnosis not present

## 2020-07-29 DIAGNOSIS — M3390 Dermatopolymyositis, unspecified, organ involvement unspecified: Secondary | ICD-10-CM | POA: Diagnosis not present

## 2020-07-30 DIAGNOSIS — M3390 Dermatopolymyositis, unspecified, organ involvement unspecified: Secondary | ICD-10-CM | POA: Diagnosis not present

## 2020-08-27 ENCOUNTER — Other Ambulatory Visit: Payer: Self-pay | Admitting: Family Medicine

## 2020-08-27 DIAGNOSIS — Z1231 Encounter for screening mammogram for malignant neoplasm of breast: Secondary | ICD-10-CM

## 2020-09-03 ENCOUNTER — Other Ambulatory Visit: Payer: Self-pay

## 2020-09-03 DIAGNOSIS — Z20822 Contact with and (suspected) exposure to covid-19: Secondary | ICD-10-CM | POA: Diagnosis not present

## 2020-09-05 LAB — SARS-COV-2, NAA 2 DAY TAT

## 2020-09-05 LAB — NOVEL CORONAVIRUS, NAA: SARS-CoV-2, NAA: NOT DETECTED

## 2020-09-14 DIAGNOSIS — M3390 Dermatopolymyositis, unspecified, organ involvement unspecified: Secondary | ICD-10-CM | POA: Diagnosis not present

## 2020-09-23 DIAGNOSIS — M3390 Dermatopolymyositis, unspecified, organ involvement unspecified: Secondary | ICD-10-CM | POA: Diagnosis not present

## 2020-09-24 DIAGNOSIS — M3390 Dermatopolymyositis, unspecified, organ involvement unspecified: Secondary | ICD-10-CM | POA: Diagnosis not present

## 2020-09-25 DIAGNOSIS — M3390 Dermatopolymyositis, unspecified, organ involvement unspecified: Secondary | ICD-10-CM | POA: Diagnosis not present

## 2020-10-01 ENCOUNTER — Ambulatory Visit
Admission: RE | Admit: 2020-10-01 | Discharge: 2020-10-01 | Disposition: A | Discharge status: Home / Self Care | Payer: 59 | Source: Ambulatory Visit | Attending: Family Medicine | Admitting: Family Medicine

## 2020-10-01 ENCOUNTER — Other Ambulatory Visit: Payer: Self-pay

## 2020-10-01 DIAGNOSIS — Z1231 Encounter for screening mammogram for malignant neoplasm of breast: Secondary | ICD-10-CM | POA: Diagnosis not present

## 2020-10-06 MED FILL — ESCITALOPRAM 20 MG TABLET: 20 | 90 days supply | Qty: 90 | Fill #1

## 2020-11-04 DIAGNOSIS — M3390 Dermatopolymyositis, unspecified, organ involvement unspecified: Secondary | ICD-10-CM | POA: Diagnosis not present

## 2020-11-23 DIAGNOSIS — M3390 Dermatopolymyositis, unspecified, organ involvement unspecified: Secondary | ICD-10-CM | POA: Diagnosis not present

## 2020-11-24 DIAGNOSIS — M3390 Dermatopolymyositis, unspecified, organ involvement unspecified: Secondary | ICD-10-CM | POA: Diagnosis not present

## 2020-11-25 DIAGNOSIS — M3390 Dermatopolymyositis, unspecified, organ involvement unspecified: Secondary | ICD-10-CM | POA: Diagnosis not present

## 2020-12-23 MED FILL — ESCITALOPRAM 20 MG TABLET: 20 | 90 days supply | Qty: 90 | Fill #2

## 2021-01-04 DIAGNOSIS — M3390 Dermatopolymyositis, unspecified, organ involvement unspecified: Secondary | ICD-10-CM | POA: Diagnosis not present

## 2021-01-12 DIAGNOSIS — M3390 Dermatopolymyositis, unspecified, organ involvement unspecified: Secondary | ICD-10-CM | POA: Diagnosis not present

## 2021-01-13 DIAGNOSIS — M3390 Dermatopolymyositis, unspecified, organ involvement unspecified: Secondary | ICD-10-CM | POA: Diagnosis not present

## 2021-01-14 DIAGNOSIS — M3390 Dermatopolymyositis, unspecified, organ involvement unspecified: Secondary | ICD-10-CM | POA: Diagnosis not present

## 2021-04-19 ENCOUNTER — Encounter (INDEPENDENT_AMBULATORY_CARE_PROVIDER_SITE_OTHER): Payer: Self-pay | Admitting: *Deleted

## 2021-05-03 ENCOUNTER — Encounter (INDEPENDENT_AMBULATORY_CARE_PROVIDER_SITE_OTHER): Payer: Self-pay | Admitting: *Deleted

## 2021-07-29 ENCOUNTER — Ambulatory Visit (INDEPENDENT_AMBULATORY_CARE_PROVIDER_SITE_OTHER): Payer: Self-pay | Admitting: Gastroenterology

## 2021-09-06 ENCOUNTER — Ambulatory Visit (INDEPENDENT_AMBULATORY_CARE_PROVIDER_SITE_OTHER): Payer: Self-pay | Admitting: Gastroenterology

## 2021-09-07 ENCOUNTER — Other Ambulatory Visit: Payer: Self-pay | Admitting: Family Medicine

## 2021-09-07 DIAGNOSIS — Z1231 Encounter for screening mammogram for malignant neoplasm of breast: Secondary | ICD-10-CM

## 2021-10-04 ENCOUNTER — Ambulatory Visit (INDEPENDENT_AMBULATORY_CARE_PROVIDER_SITE_OTHER): Payer: Self-pay | Admitting: Gastroenterology

## 2021-10-12 ENCOUNTER — Ambulatory Visit
Admission: RE | Admit: 2021-10-12 | Discharge: 2021-10-12 | Disposition: A | Discharge status: Home / Self Care | Payer: Commercial Managed Care - PPO | Source: Ambulatory Visit | Attending: Family Medicine | Admitting: Family Medicine

## 2021-10-12 ENCOUNTER — Other Ambulatory Visit: Payer: Self-pay

## 2021-10-12 DIAGNOSIS — Z1231 Encounter for screening mammogram for malignant neoplasm of breast: Secondary | ICD-10-CM

## 2021-10-18 ENCOUNTER — Other Ambulatory Visit: Payer: Self-pay | Admitting: Family Medicine

## 2021-10-18 DIAGNOSIS — R928 Other abnormal and inconclusive findings on diagnostic imaging of breast: Secondary | ICD-10-CM

## 2021-10-21 ENCOUNTER — Other Ambulatory Visit: Payer: Self-pay

## 2021-10-21 ENCOUNTER — Ambulatory Visit: Payer: Commercial Managed Care - PPO

## 2021-10-21 ENCOUNTER — Ambulatory Visit
Admission: RE | Admit: 2021-10-21 | Discharge: 2021-10-21 | Disposition: A | Discharge status: Home / Self Care | Payer: Commercial Managed Care - PPO | Source: Ambulatory Visit | Attending: Family Medicine | Admitting: Family Medicine

## 2021-10-21 DIAGNOSIS — R928 Other abnormal and inconclusive findings on diagnostic imaging of breast: Secondary | ICD-10-CM

## 2021-10-28 ENCOUNTER — Other Ambulatory Visit: Payer: Self-pay

## 2021-10-28 ENCOUNTER — Telehealth (INDEPENDENT_AMBULATORY_CARE_PROVIDER_SITE_OTHER): Payer: Self-pay

## 2021-10-28 ENCOUNTER — Encounter (INDEPENDENT_AMBULATORY_CARE_PROVIDER_SITE_OTHER): Payer: Self-pay

## 2021-10-28 ENCOUNTER — Other Ambulatory Visit (INDEPENDENT_AMBULATORY_CARE_PROVIDER_SITE_OTHER): Payer: Self-pay

## 2021-10-28 ENCOUNTER — Encounter (INDEPENDENT_AMBULATORY_CARE_PROVIDER_SITE_OTHER): Payer: Self-pay | Admitting: Gastroenterology

## 2021-10-28 ENCOUNTER — Ambulatory Visit (INDEPENDENT_AMBULATORY_CARE_PROVIDER_SITE_OTHER): Payer: Commercial Managed Care - PPO | Admitting: Gastroenterology

## 2021-10-28 VITALS — BP 119/78 | HR 87 | Temp 98.6°F | Ht 64.0 in | Wt 166.4 lb

## 2021-10-28 DIAGNOSIS — Z1211 Encounter for screening for malignant neoplasm of colon: Secondary | ICD-10-CM

## 2021-10-28 DIAGNOSIS — K582 Mixed irritable bowel syndrome: Secondary | ICD-10-CM | POA: Insufficient documentation

## 2021-10-28 DIAGNOSIS — Z86010 Personal history of colonic polyps: Secondary | ICD-10-CM

## 2021-10-28 MED ORDER — PEG 3350-KCL-NA BICARB-NACL 420 G PO SOLR: 4000.0000 mL | ORAL | 0 refills | Status: DC

## 2021-10-28 MED ORDER — DICYCLOMINE HCL 10 MG PO CAPS: 10.0000 mg | ORAL_CAPSULE | Freq: Three times a day (TID) | ORAL | 3 refills | Status: AC | PRN

## 2021-10-28 NOTE — Telephone Encounter (Signed)
LeighAnn Airabella Barley, CMA  

## 2021-10-28 NOTE — Progress Notes (Addendum)
Referring Provider: Sharilyn Sites, MD Primary Care Physician:  Sharilyn Sites, MD Primary GI Physician:   Chief Complaint  Patient presents with   New Patient (Initial Visit)    Pt arrives as a new patient today for a change in bowel habits. States it is time for her colonoscopy. Has some constipation.    HPI:   Amy Goodman is a 60 y.o. female with past medical history of dermatomyositis, headaches, anxiety, thrombocythemia.  Patient presenting today as a new patient, referred by Dr. Hilma Favors for constipation and diarrhea/due for colonoscopy.  Patient reports that she gets IV IG infusions every 8 weeks, she reports that she does it 3 days in a row for 6 hours at a time. She notices constipation during that time that lasts about 5 days. She will sometimes go 2-3 days without a BM. she will take a stool softener then to help her go as she will have to strain quite a bit otherwise. She has good results with stool softener. when she is not having her infusions, she will have 3-4 BMs per day that are loose to watery in consistency. She will sometimes take imodium which provides some relief. She reports that she has fecal urgency and will often have to get to a restroom quickly, especially after eating, she denies fecal incontinence or nocturnal fecal incontinence. She will have occasional lower abdominal pain when she has episodes of diarrhea. She has some bloating and gas at times when she is having diarrhea, though she is not taking anything for this. She denies any changes in her appetite and tries to eat a fairly well balanced diet, she avoids fast foods.   She has no acid reflux or acid regurgitation.  No red flag symptoms. Patient denies melena, hematochezia, nausea, vomiting, dysphagia, odyonophagia, early satiety or weight loss.   NSAID use:no NSAID Social hx:no tobacco, etoh occasionally Fam hx:no crc or liver disease  Last Colonoscopy:08/18/11 Normal terminal ileum. 4 mm polyp  ablated via cold biopsy from  ascending colon. Tubular adenoma 10 mm sessile polyp at proximal transverse colon; tubular adenoma Small external hemorrhoids. Last Endoscopy:15 years ago?  Recommendations:  Was due for colonoscopy in 2017 r/t tubular adenomas  Past Medical History:  Diagnosis Date   Dermatomyositis ([place])    Takes IVIG monthly at home.   Headache     Past Surgical History:  Procedure Laterality Date   ABDOMINAL HYSTERECTOMY     BUNIONECTOMY Bilateral    CATARACT EXTRACTION W/PHACO Left 07/06/2015   Procedure: CATARACT EXTRACTION PHACO AND INTRAOCULAR LENS PLACEMENT LEFT EYE;  Surgeon: Tonny Branch, MD;  Location: [facility];  Service: Ophthalmology;  Laterality: Left;  CDE:3.23   CATARACT EXTRACTION W/PHACO Right 08/17/2015   Procedure: CATARACT EXTRACTION PHACO AND INTRAOCULAR LENS PLACEMENT RIGHT EYE CDE=3.44;  Surgeon: Tonny Branch, MD;  Location: [facility];  Service: Ophthalmology;  Laterality: Right;   COLONOSCOPY  08/18/2011   Procedure: COLONOSCOPY;  Surgeon: Rogene Houston, MD;  Location: [facility];  Service: Endoscopy;  Laterality: N/A;   NECK SURGERY     cervical disc    Current Outpatient Medications  Medication Sig Dispense Refill   acetaminophen (TYLENOL) 500 MG tablet Take 500 mg by mouth every 6 (six) hours as needed for headache.     Ascorbic Acid (VITAMIN C) 1000 MG tablet Take 1,000 mg by mouth daily. Takes 2 daily     escitalopram (LEXAPRO) 20 MG tablet TAKE 1 TABLET BY MOUTH DAILY 90 tablet 2  Immune Globulin, Human, (PRIVIGEN) 40 GM/400ML SOLN Inject into the vein.     meclizine (ANTIVERT) 25 MG tablet Take 1 tablet (25 mg total) by mouth 3 (three) times daily as needed for dizziness. 30 tablet 0   SUMAtriptan (IMITREX) 25 MG tablet Take 25 mg by mouth every 2 (two) hours as needed for migraine. May repeat in 2 hours if headache persists or recurs.     Vitamin D, Ergocalciferol, (DRISDOL) 50000 units CAPS capsule Take 1 capsule by mouth once a week.  0    No current facility-administered medications for this visit.    Allergies as of 10/28/2021 - Review Complete 10/28/2021  Allergen Reaction Noted   Amoxicillin Hives    Penicillins Hives     Family History  Problem Relation Age of Onset   Breast cancer Mother    Breast cancer Paternal Aunt     Social History   Socioeconomic History   Marital status: Married    Spouse name: Not on file   Number of children: Not on file   Years of education: Not on file   Highest education level: Not on file  Occupational History   Not on file  Tobacco Use   Smoking status: Never   Smokeless tobacco: Never  Substance and Sexual Activity   Alcohol use: Yes    Comment: occasional   Drug use: No   Sexual activity: Yes    Birth control/protection: None, Surgical  Other Topics Concern   Not on file  Social History Narrative   Not on file   Social Determinants of Health   Financial Resource Strain: Not on file  Food Insecurity: Not on file  Transportation Needs: Not on file  Physical Activity: Not on file  Stress: Not on file  Social Connections: Not on file   Review of systems General: negative for malaise, night sweats, fever, chills, weight los Neck: Negative for lumps, goiter, pain and significant neck swelling Resp: Negative for cough, wheezing, dyspnea at rest CV: Negative for chest pain, leg swelling, palpitations, orthopnea GI: denies melena, hematochezia, nausea, vomiting, dysphagia, odyonophagia, early satiety or unintentional weight loss. +diarrhea +constipation +lower abdominal pain MSK: Negative for joint pain or swelling, back pain, and muscle pain. Derm: Negative for itching or rash Psych: Denies depression, anxiety, memory loss, confusion. No homicidal or suicidal ideation.  Heme: Negative for prolonged bleeding, bruising easily, and swollen nodes. Endocrine: Negative for cold or heat intolerance, polyuria, polydipsia and goiter. Neuro: negative for tremor, gait  imbalance, syncope and seizures. The remainder of the review of systems is noncontributory.  Physical Exam: BP 119/78 (BP Location: Right Arm, Patient Position: Sitting, Cuff Size: Large)   Pulse 87   Temp 98.6 F (37 C) (Oral)   Ht 5\' 4"  (1.626 m)   Wt 166 lb 6.4 oz (75.5 kg)   BMI 28.56 kg/m  General:   Alert and oriented. No distress noted. Pleasant and cooperative.  Head:  Normocephalic and atraumatic. Eyes:  Conjuctiva clear without scleral icterus. Mouth:  Oral mucosa pink and moist. Good dentition. No lesions. Heart: Normal rate and rhythm, s1 and s2 heart sounds present.  Lungs: Clear lung sounds in all lobes. Respirations equal and unlabored. Abdomen:  +BS, soft, non-tender and non-distended. No rebound or guarding. No HSM or masses noted. Derm: No palmar erythema or jaundice Msk:  Symmetrical without gross deformities. Normal posture. Extremities:  Without edema. Neurologic:  Alert and  oriented x4 Psych:  Alert and cooperative. Normal mood and affect.  Invalid input(s): 6 MONTHS   ASSESSMENT: KATHERINNE MOFIELD is a 60 y.o. female with history of dermatomyositis, headaches, anxiety, thrombocythemia. presenting today for diarrhea/constipation and is due for colonoscopy.  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 This has been an ongoing issue for some time. TSH was 1.6 in April. Her constipation could be related to her IVIG infusions as she only experiences constipation during that time, however, she has fecal urgency, especially after eating with 3-4 loose stools per day, otherwise. We will try bentyl 10mg  TID PRN, she should avoid this during times of constipation.  I also discussed trying benefiber with probiotics as this can help to regular her GI tract and help firm up her stools. She can try IBgard otc for bloating/gas/abdominal discomfort as well.   We will also get her setup for surveillance  colonoscopy as she had tubular adenomas in 2012 and is past due for her 5 year repeat colonoscopy.   PLAN:  Rx for bentyl 10mg  TID PRN 2. Start Benefiber with probiotics daily 3. Try IBgard otc for bloating/gas 4. Schedule colonoscopy   Follow Up: 3 months  Aniqa Hare L. Alver Sorrow, MSN, APRN, AGNP-C Adult-Gerontology Nurse Practitioner [facility]

## 2021-10-28 NOTE — Patient Instructions (Addendum)
I suspect you may have some underlying IBS. I am providing a copy of the low FODMAP diet, this can be helpful for people with IBS, however, if you find it is too difficult to follow, do not stress, this is just something you can try to see if it helps. You can also keep track of foods you eat to see if certain things seem to worsen your diarrhea.  I have sent bentyl 10mg , you can take this up to 3 times per day, this should help to slow down your bowels some during times when you are having fecal urgency. Please do not take this during times of constipation.   You can try over the counter benefiber with probiotics as well to help support your GI tract and IBgard which is the pepermint oil that can help with bloating and gas.  We will get you scheduled for your colonoscopy.  Follow up in 3 months! It was a pleasure caring for you today

## 2021-10-28 NOTE — H&P (View-Only) (Signed)
Referring Provider: Sharilyn Sites, MD Primary Care Physician:  Sharilyn Sites, MD Primary GI Physician:   Chief Complaint  Patient presents with   New Patient (Initial Visit)    Pt arrives as a new patient today for a change in bowel habits. States it is time for her colonoscopy. Has some constipation.    HPI:   Amy Goodman is a 60 y.o. female with past medical history of dermatomyositis, headaches, anxiety, thrombocythemia.  Patient presenting today as a new patient, referred by Dr. Hilma Favors for constipation and diarrhea/due for colonoscopy.  Patient reports that she gets IV IG infusions every 8 weeks, she reports that she does it 3 days in a row for 6 hours at a time. She notices constipation during that time that lasts about 5 days. She will sometimes go 2-3 days without a BM. she will take a stool softener then to help her go as she will have to strain quite a bit otherwise. She has good results with stool softener. when she is not having her infusions, she will have 3-4 BMs per day that are loose to watery in consistency. She will sometimes take imodium which provides some relief. She reports that she has fecal urgency and will often have to get to a restroom quickly, especially after eating, she denies fecal incontinence or nocturnal fecal incontinence. She will have occasional lower abdominal pain when she has episodes of diarrhea. She has some bloating and gas at times when she is having diarrhea, though she is not taking anything for this. She denies any changes in her appetite and tries to eat a fairly well balanced diet, she avoids fast foods.   She has no acid reflux or acid regurgitation.  No red flag symptoms. Patient denies melena, hematochezia, nausea, vomiting, dysphagia, odyonophagia, early satiety or weight loss.   NSAID use:no NSAID Social hx:no tobacco, etoh occasionally Fam hx:no crc or liver disease  Last Colonoscopy:08/18/11 Normal terminal ileum. 4 mm polyp  ablated via cold biopsy from  ascending colon. Tubular adenoma 10 mm sessile polyp at proximal transverse colon; tubular adenoma Small external hemorrhoids. Last Endoscopy:15 years ago?  Recommendations:  Was due for colonoscopy in 2017 r/t tubular adenomas  Past Medical History:  Diagnosis Date   Dermatomyositis ([place])    Takes IVIG monthly at home.   Headache     Past Surgical History:  Procedure Laterality Date   ABDOMINAL HYSTERECTOMY     BUNIONECTOMY Bilateral    CATARACT EXTRACTION W/PHACO Left 07/06/2015   Procedure: CATARACT EXTRACTION PHACO AND INTRAOCULAR LENS PLACEMENT LEFT EYE;  Surgeon: Tonny Branch, MD;  Location: [facility];  Service: Ophthalmology;  Laterality: Left;  CDE:3.23   CATARACT EXTRACTION W/PHACO Right 08/17/2015   Procedure: CATARACT EXTRACTION PHACO AND INTRAOCULAR LENS PLACEMENT RIGHT EYE CDE=3.44;  Surgeon: Tonny Branch, MD;  Location: [facility];  Service: Ophthalmology;  Laterality: Right;   COLONOSCOPY  08/18/2011   Procedure: COLONOSCOPY;  Surgeon: Rogene Houston, MD;  Location: [facility];  Service: Endoscopy;  Laterality: N/A;   NECK SURGERY     cervical disc    Current Outpatient Medications  Medication Sig Dispense Refill   acetaminophen (TYLENOL) 500 MG tablet Take 500 mg by mouth every 6 (six) hours as needed for headache.     Ascorbic Acid (VITAMIN C) 1000 MG tablet Take 1,000 mg by mouth daily. Takes 2 daily     escitalopram (LEXAPRO) 20 MG tablet TAKE 1 TABLET BY MOUTH DAILY 90 tablet 2  Immune Globulin, Human, (PRIVIGEN) 40 GM/400ML SOLN Inject into the vein.     meclizine (ANTIVERT) 25 MG tablet Take 1 tablet (25 mg total) by mouth 3 (three) times daily as needed for dizziness. 30 tablet 0   SUMAtriptan (IMITREX) 25 MG tablet Take 25 mg by mouth every 2 (two) hours as needed for migraine. May repeat in 2 hours if headache persists or recurs.     Vitamin D, Ergocalciferol, (DRISDOL) 50000 units CAPS capsule Take 1 capsule by mouth once a week.  0    No current facility-administered medications for this visit.    Allergies as of 10/28/2021 - Review Complete 10/28/2021  Allergen Reaction Noted   Amoxicillin Hives    Penicillins Hives     Family History  Problem Relation Age of Onset   Breast cancer Mother    Breast cancer Paternal Aunt     Social History   Socioeconomic History   Marital status: Married    Spouse name: Not on file   Number of children: Not on file   Years of education: Not on file   Highest education level: Not on file  Occupational History   Not on file  Tobacco Use   Smoking status: Never   Smokeless tobacco: Never  Substance and Sexual Activity   Alcohol use: Yes    Comment: occasional   Drug use: No   Sexual activity: Yes    Birth control/protection: None, Surgical  Other Topics Concern   Not on file  Social History Narrative   Not on file   Social Determinants of Health   Financial Resource Strain: Not on file  Food Insecurity: Not on file  Transportation Needs: Not on file  Physical Activity: Not on file  Stress: Not on file  Social Connections: Not on file   Review of systems General: negative for malaise, night sweats, fever, chills, weight los Neck: Negative for lumps, goiter, pain and significant neck swelling Resp: Negative for cough, wheezing, dyspnea at rest CV: Negative for chest pain, leg swelling, palpitations, orthopnea GI: denies melena, hematochezia, nausea, vomiting, dysphagia, odyonophagia, early satiety or unintentional weight loss. +diarrhea +constipation +lower abdominal pain MSK: Negative for joint pain or swelling, back pain, and muscle pain. Derm: Negative for itching or rash Psych: Denies depression, anxiety, memory loss, confusion. No homicidal or suicidal ideation.  Heme: Negative for prolonged bleeding, bruising easily, and swollen nodes. Endocrine: Negative for cold or heat intolerance, polyuria, polydipsia and goiter. Neuro: negative for tremor, gait  imbalance, syncope and seizures. The remainder of the review of systems is noncontributory.  Physical Exam: BP 119/78 (BP Location: Right Arm, Patient Position: Sitting, Cuff Size: Large)   Pulse 87   Temp 98.6 F (37 C) (Oral)   Ht 5\' 4"  (1.626 m)   Wt 166 lb 6.4 oz (75.5 kg)   BMI 28.56 kg/m  General:   Alert and oriented. No distress noted. Pleasant and cooperative.  Head:  Normocephalic and atraumatic. Eyes:  Conjuctiva clear without scleral icterus. Mouth:  Oral mucosa pink and moist. Good dentition. No lesions. Heart: Normal rate and rhythm, s1 and s2 heart sounds present.  Lungs: Clear lung sounds in all lobes. Respirations equal and unlabored. Abdomen:  +BS, soft, non-tender and non-distended. No rebound or guarding. No HSM or masses noted. Derm: No palmar erythema or jaundice Msk:  Symmetrical without gross deformities. Normal posture. Extremities:  Without edema. Neurologic:  Alert and  oriented x4 Psych:  Alert and cooperative. Normal mood and affect.  Invalid input(s): 6 MONTHS   ASSESSMENT: Amy Goodman is a 60 y.o. female with history of dermatomyositis, headaches, anxiety, thrombocythemia. presenting today for diarrhea/constipation and is due for colonoscopy.  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 This has been an ongoing issue for some time. TSH was 1.6 in April. Her constipation could be related to her IVIG infusions as she only experiences constipation during that time, however, she has fecal urgency, especially after eating with 3-4 loose stools per day, otherwise. We will try bentyl 10mg  TID PRN, she should avoid this during times of constipation.  I also discussed trying benefiber with probiotics as this can help to regular her GI tract and help firm up her stools. She can try IBgard otc for bloating/gas/abdominal discomfort as well.   We will also get her setup for surveillance  colonoscopy as she had tubular adenomas in 2012 and is past due for her 5 year repeat colonoscopy.   PLAN:  Rx for bentyl 10mg  TID PRN 2. Start Benefiber with probiotics daily 3. Try IBgard otc for bloating/gas 4. Schedule colonoscopy   Follow Up: 3 months  Tilly Pernice L. Alver Sorrow, MSN, APRN, AGNP-C Adult-Gerontology Nurse Practitioner [facility]

## 2021-11-08 ENCOUNTER — Encounter (INDEPENDENT_AMBULATORY_CARE_PROVIDER_SITE_OTHER): Payer: Self-pay

## 2021-11-10 ENCOUNTER — Other Ambulatory Visit: Payer: Self-pay

## 2021-11-10 ENCOUNTER — Encounter (HOSPITAL_COMMUNITY)
Admission: RE | Admit: 2021-11-10 | Discharge: 2021-11-10 | Disposition: A | Discharge status: Home / Self Care | Payer: Commercial Managed Care - PPO | Source: Ambulatory Visit | Attending: Gastroenterology | Admitting: Gastroenterology

## 2021-11-10 NOTE — Patient Instructions (Signed)
Amy Goodman  11/10/2021     @PREFPERIOPPHARMACY @   Your procedure is scheduled on 11/12/2021.  Report to [name] at 8:30 A.M.  Call this number if you have problems the morning of surgery:  [phone]   Remember:   Please follow the diet and prep instructions given to you by the doctors office    Take these medicines the morning of surgery with A SIP OF WATER : Lexapro and Imitrex if needed    Do not wear jewelry, make-up or nail polish.  Do not wear lotions, powders, or perfumes, or deodorant.  Do not shave 48 hours prior to surgery.  Men may shave face and neck.  Do not bring valuables to the hospital.  [facility] is not responsible for any belongings or valuables.  Contacts, dentures or bridgework may not be worn into surgery.  Leave your suitcase in the car.  After surgery it may be brought to your room.  For patients admitted to the hospital, discharge time will be determined by your treatment team.  Patients discharged the day of surgery will not be allowed to drive home.   Name and phone number of your driver:   Family Special instructions:  N/A  Please read over the following fact sheets that you were given. Care and Recovery After Surgery    Colonoscopy, Adult A colonoscopy is a procedure to look at the entire large intestine. This procedure is done using a long, thin, flexible tube that has a camera on the end. You may have a colonoscopy: As a part of normal colorectal screening. If you have certain symptoms, such as: A low number of red blood cells in your blood (anemia). Diarrhea that does not go away. Pain in your abdomen. Blood in your stool. A colonoscopy can help screen for and diagnose medical problems, including: Tumors. Extra tissue that grows where mucus forms (polyps). Inflammation. Areas of bleeding. Tell your health care provider about: Any allergies you have. All medicines you are taking, including vitamins, herbs, eye drops,  creams, and over-the-counter medicines. Any problems you or family members have had with anesthetic medicines. Any blood disorders you have. Any surgeries you have had. Any medical conditions you have. Any problems you have had with having bowel movements. Whether you are pregnant or may be pregnant. What are the risks? Generally, this is a safe procedure. However, problems may occur, including: Bleeding. Damage to your intestine. Allergic reactions to medicines given during the procedure. Infection. This is rare. What happens before the procedure? Eating and drinking restrictions Follow instructions from your health care provider about eating or drinking restrictions, which may include: A few days before the procedure: Follow a low-fiber diet. Avoid nuts, seeds, dried fruit, raw fruits, and vegetables. 1-3 days before the procedure: Eat only gelatin dessert or ice pops. Drink only clear liquids, such as water, clear juice, clear broth or bouillon, black coffee or tea, or clear soft drinks or sports drinks. Avoid liquids that contain red or purple dye. The day of the procedure: Do not eat solid foods. You may continue to drink clear liquids until up to 2 hours before the procedure. Do not eat or drink anything starting 2 hours before the procedure, or within the time period that your health care provider recommends. Bowel prep If you were prescribed a bowel prep to take by mouth (orally) to clean out your colon: Take it as told by your health care provider. Starting the day before your procedure,  you will need to drink a large amount of liquid medicine. The liquid will cause you to have many bowel movements of loose stool until your stool becomes almost clear or light green. If your skin or the opening between the buttocks (anus) gets irritated from diarrhea, you may relieve the irritation using: Wipes with medicine in them, such as adult wet wipes with aloe and vitamin E. A product to  soothe skin, such as petroleum jelly. If you vomit while drinking the bowel prep: Take a break for up to 60 minutes. Begin the bowel prep again. Call your health care provider if you keep vomiting or you cannot take the bowel prep without vomiting. To clean out your colon, you may also be given: Laxative medicines. These help you have a bowel movement. Instructions for enema use. An enema is liquid medicine injected into your rectum. Medicines Ask your health care provider about: Changing or stopping your regular medicines or supplements. This is especially important if you are taking iron supplements, diabetes medicines, or blood thinners. Taking medicines such as aspirin and ibuprofen. These medicines can thin your blood. Do not take these medicines unless your health care provider tells you to take them. Taking over-the-counter medicines, vitamins, herbs, and supplements. General instructions Ask your health care provider what steps will be taken to help prevent infection. These may include washing skin with a germ-killing soap. Plan to have someone take you home from the hospital or clinic. What happens during the procedure?  An IV will be inserted into one of your veins. You may be given one or more of the following: A medicine to help you relax (sedative). A medicine to numb the area (local anesthetic). A medicine to make you fall asleep (general anesthetic). This is rarely needed. You will lie on your side with your knees bent. The tube will: Have oil or gel put on it (be lubricated). Be inserted into your anus. Be gently eased through all parts of your large intestine. Air will be sent into your colon to keep it open. This may cause some pressure or cramping. Images will be taken with the camera and will appear on a screen. A small tissue sample may be removed to be looked at under a microscope (biopsy). The tissue may be sent to a lab for testing if any signs of problems are  found. If small polyps are found, they may be removed and checked for cancer cells. When the procedure is finished, the tube will be removed. The procedure may vary among health care providers and hospitals. What happens after the procedure? Your blood pressure, heart rate, breathing rate, and blood oxygen level will be monitored until you leave the hospital or clinic. You may have a small amount of blood in your stool. You may pass gas and have mild cramping or bloating in your abdomen. This is caused by the air that was used to open your colon during the exam. Do not drive for 24 hours after the procedure. It is up to you to get the results of your procedure. Ask your health care provider, or the department that is doing the procedure, when your results will be ready. Summary A colonoscopy is a procedure to look at the entire large intestine. Follow instructions from your health care provider about eating and drinking before the procedure. If you were prescribed an oral bowel prep to clean out your colon, take it as told by your health care provider. During the colonoscopy, a flexible  tube with a camera on its end is inserted into the anus and then passed into the other parts of the large intestine. This information is not intended to replace advice given to you by your health care provider. Make sure you discuss any questions you have with your health care provider. Document Revised: 07/05/2019 Document Reviewed: 07/05/2019 Elsevier Patient Education  [place].

## 2021-11-12 ENCOUNTER — Encounter (HOSPITAL_COMMUNITY)
Admission: RE | Disposition: A | Discharge status: Home / Self Care | Payer: Self-pay | Source: Home / Self Care | Attending: Gastroenterology

## 2021-11-12 ENCOUNTER — Ambulatory Visit (HOSPITAL_COMMUNITY): Payer: Commercial Managed Care - PPO | Admitting: Anesthesiology

## 2021-11-12 ENCOUNTER — Ambulatory Visit (HOSPITAL_COMMUNITY)
Admission: RE | Admit: 2021-11-12 | Discharge: 2021-11-12 | Disposition: A | Discharge status: Home / Self Care | Payer: Commercial Managed Care - PPO | Attending: Gastroenterology | Admitting: Gastroenterology

## 2021-11-12 ENCOUNTER — Encounter (HOSPITAL_COMMUNITY): Payer: Self-pay | Admitting: Gastroenterology

## 2021-11-12 DIAGNOSIS — D122 Benign neoplasm of ascending colon: Secondary | ICD-10-CM | POA: Diagnosis not present

## 2021-11-12 DIAGNOSIS — K648 Other hemorrhoids: Secondary | ICD-10-CM | POA: Diagnosis not present

## 2021-11-12 DIAGNOSIS — F419 Anxiety disorder, unspecified: Secondary | ICD-10-CM | POA: Diagnosis not present

## 2021-11-12 DIAGNOSIS — G709 Myoneural disorder, unspecified: Secondary | ICD-10-CM | POA: Insufficient documentation

## 2021-11-12 DIAGNOSIS — Z86010 Personal history of colonic polyps: Secondary | ICD-10-CM | POA: Insufficient documentation

## 2021-11-12 DIAGNOSIS — D124 Benign neoplasm of descending colon: Secondary | ICD-10-CM | POA: Diagnosis not present

## 2021-11-12 DIAGNOSIS — Z1211 Encounter for screening for malignant neoplasm of colon: Secondary | ICD-10-CM | POA: Insufficient documentation

## 2021-11-12 DIAGNOSIS — K5900 Constipation, unspecified: Secondary | ICD-10-CM | POA: Insufficient documentation

## 2021-11-12 HISTORY — PX: POLYPECTOMY: SHX5525

## 2021-11-12 HISTORY — PX: COLONOSCOPY WITH PROPOFOL: SHX5780

## 2021-11-12 LAB — HM COLONOSCOPY

## 2021-11-12 SURGERY — COLONOSCOPY WITH PROPOFOL
Anesthesia: General

## 2021-11-12 MED ORDER — PROPOFOL 1000 MG/100ML IV EMUL
INTRAVENOUS | Status: AC
  Filled 2021-11-12: qty 100

## 2021-11-12 MED ORDER — PROPOFOL 500 MG/50ML IV EMUL
INTRAVENOUS | Status: DC | PRN
  Administered 2021-11-12: 160 ug/kg/min via INTRAVENOUS

## 2021-11-12 MED ORDER — LACTATED RINGERS IV SOLN
INTRAVENOUS | Status: DC
  Administered 2021-11-12: 1000 mL via INTRAVENOUS

## 2021-11-12 MED ORDER — PROPOFOL 10 MG/ML IV BOLUS
INTRAVENOUS | Status: DC | PRN
  Administered 2021-11-12: 60 mg via INTRAVENOUS

## 2021-11-12 NOTE — Interval H&P Note (Signed)
History and Physical Interval Note:  11/12/2021 9:22 AM 60 y.o. female with past medical history of dermatomyositis, headaches, anxiety, thrombocythemia, coming for follow up due to history of colon poylps.  BP 92/65   Pulse (!) 59   Temp 97.9 F (36.6 C) (Oral)   Resp 18   SpO2 100%  GENERAL: The patient is AO x3, in no acute distress. HEENT: Head is normocephalic and atraumatic. EOMI are intact. Mouth is well hydrated and without lesions. NECK: Supple. No masses LUNGS: Clear to auscultation. No presence of rhonchi/wheezing/rales. Adequate chest expansion HEART: RRR, normal s1 and s2. ABDOMEN: Soft, nontender, no guarding, no peritoneal signs, and nondistended. BS +. No masses. EXTREMITIES: Without any cyanosis, clubbing, rash, lesions or edema. NEUROLOGIC: AOx3, no focal motor deficit. SKIN: no jaundice, no rashes  Amy Goodman  has presented today for surgery, with the diagnosis of Screening Colonoscopy.  The various methods of treatment have been discussed with the patient and family. After consideration of risks, benefits and other options for treatment, the patient has consented to  Procedure(s) with comments: COLONOSCOPY WITH PROPOFOL (N/A) - 10:00 as a surgical intervention.  The patient's history has been reviewed, patient examined, no change in status, stable for surgery.  I have reviewed the patient's chart and labs.  Questions were answered to the patient's satisfaction.     Amy Goodman

## 2021-11-12 NOTE — Transfer of Care (Signed)
Immediate Anesthesia Transfer of Care Note  Patient: Amy Goodman  Procedure(s) Performed: COLONOSCOPY WITH PROPOFOL POLYPECTOMY  Patient Location: PACU  Anesthesia Type:General  Level of Consciousness: awake, alert , oriented and patient cooperative  Airway & Oxygen Therapy: Patient Spontanous Breathing  Post-op Assessment: Report given to RN, Post -op Vital signs reviewed and stable and Patient moving all extremities X 4  Post vital signs: Reviewed and stable  Last Vitals:  Vitals Value Taken Time  BP    Temp    Pulse    Resp    SpO2      Last Pain:  Vitals:   11/12/21 0924  TempSrc:   PainSc: 0-No pain      Patients Stated Pain Goal: 6 (82/50/53 [id number])  Complications: No notable events documented.

## 2021-11-12 NOTE — Op Note (Signed)
[facility] Patient Name: Amy Goodman Procedure Date: 11/12/2021 9:14 AM MRN: [id number] Date of Birth: 04-28-1961 Attending MD: Maylon Peppers ,  CSN: [id number] Age: 60 Admit Type: Outpatient Procedure:                Colonoscopy Indications:              High risk colon cancer surveillance: Personal                            history of adenoma less than 10 mm in size Providers:                Maylon Peppers, Rosina Lowenstein, RN, Casimer Bilis, Technician Referring MD:              Medicines:                Monitored Anesthesia Care Complications:            No immediate complications. Estimated Blood Loss:     Estimated blood loss: none. Procedure:                Pre-Anesthesia Assessment:                           - Prior to the procedure, a History and Physical                            was performed, and patient medications, allergies                            and sensitivities were reviewed. The patient's                            tolerance of previous anesthesia was reviewed.                           - The risks and benefits of the procedure and the                            sedation options and risks were discussed with the                            patient. All questions were answered and informed                            consent was obtained.                           - ASA Grade Assessment: II - A patient with mild                            systemic disease.                           After obtaining informed consent, the colonoscope  was passed under direct vision. Throughout the                            procedure, the patient's blood pressure, pulse, and                            oxygen saturations were monitored continuously. The                            [id number]-HQ190L ([id number]) scope was introduced through                            the anus and advanced to the the cecum, identified                             by appendiceal orifice and ileocecal valve. The                            colonoscopy was performed without difficulty. The                            patient tolerated the procedure well. The quality                            of the bowel preparation was excellent. Scope In: 9:29:15 AM Scope Out: 9:51:30 AM Scope Withdrawal Time: 0 hours 17 minutes 19 seconds  Total Procedure Duration: 0 hours 22 minutes 15 seconds  Findings:      The perianal and digital rectal examinations were normal.      Two sessile polyps were found in the descending colon and ascending       colon. The polyps were 3 to 4 mm in size. These polyps were removed with       a cold snare. Resection and retrieval were complete.      Non-bleeding internal hemorrhoids were found during retroflexion. The       hemorrhoids were small. Impression:               - Two 3 to 4 mm polyps in the descending colon and                            in the ascending colon, removed with a cold snare.                            Resected and retrieved.                           - Non-bleeding internal hemorrhoids. Moderate Sedation:      Per Anesthesia Care Recommendation:           - Discharge patient to home (ambulatory).                           - Resume previous diet.                           -  Await pathology results.                           - Repeat colonoscopy date to be determined after                            pending pathology results are reviewed for                            surveillance. Procedure Code(s):        --- Professional ---                           [phone], Colonoscopy, flexible; with removal of                            tumor(s), polyp(s), or other lesion(s) by snare                            technique Diagnosis Code(s):        --- Professional ---                           Z86.010, Personal history of colonic polyps                           K63.5, Polyp of colon                            K64.8, Other hemorrhoids CPT copyright 2019 American Medical Association. All rights reserved. The codes documented in this report are preliminary and upon coder review may  be revised to meet current compliance requirements. Maylon Peppers, MD Maylon Peppers,  11/12/2021 9:54:13 AM This report has been signed electronically. Number of Addenda: 0

## 2021-11-12 NOTE — Anesthesia Preprocedure Evaluation (Signed)
Anesthesia Evaluation  Patient identified by MRN, date of birth, ID band Patient awake    Reviewed: Allergy & Precautions, H&P , NPO status , Patient's Chart, lab work & pertinent test results, reviewed documented beta blocker date and time   Airway Mallampati: II  TM Distance: >3 FB Neck ROM: full    Dental no notable dental hx.    Pulmonary neg pulmonary ROS,    Pulmonary exam normal breath sounds clear to auscultation       Cardiovascular Exercise Tolerance: Good negative cardio ROS   Rhythm:regular Rate:Normal     Neuro/Psych  Headaches, Anxiety  Neuromuscular disease negative psych ROS   GI/Hepatic negative GI ROS, Neg liver ROS,   Endo/Other  negative endocrine ROS  Renal/GU negative Renal ROS  negative genitourinary   Musculoskeletal   Abdominal   Peds  Hematology negative hematology ROS (+)   Anesthesia Other Findings   Reproductive/Obstetrics negative OB ROS                             Anesthesia Physical Anesthesia Plan  ASA: 2  Anesthesia Plan: General   Post-op Pain Management:    Induction:   PONV Risk Score and Plan: Propofol infusion  Airway Management Planned:   Additional Equipment:   Intra-op Plan:   Post-operative Plan:   Informed Consent: I have reviewed the patients History and Physical, chart, labs and discussed the procedure including the risks, benefits and alternatives for the proposed anesthesia with the patient or authorized representative who has indicated his/her understanding and acceptance.     Dental Advisory Given  Plan Discussed with: CRNA  Anesthesia Plan Comments:         Anesthesia Quick Evaluation

## 2021-11-12 NOTE — Discharge Instructions (Addendum)
You are being discharged to home.  Resume your previous diet.  We are waiting for your pathology results.  Your physician has recommended a repeat colonoscopy (date to be determined after pending pathology results are reviewed) for surveillance.  

## 2021-11-12 NOTE — Anesthesia Postprocedure Evaluation (Signed)
Anesthesia Post Note  Patient: Amy Goodman  Procedure(s) Performed: COLONOSCOPY WITH PROPOFOL POLYPECTOMY  Patient location during evaluation: Phase II Anesthesia Type: General Level of consciousness: awake Pain management: pain level controlled Vital Signs Assessment: post-procedure vital signs reviewed and stable Respiratory status: spontaneous breathing and respiratory function stable Cardiovascular status: blood pressure returned to baseline and stable Postop Assessment: no headache and no apparent nausea or vomiting Anesthetic complications: no Comments: Late entry   No notable events documented.   Last Vitals:  Vitals:   11/12/21 0837 11/12/21 1001  BP: 92/65 (!) 102/56  Pulse: (!) 59 87  Resp: 18 17  Temp: 36.6 C   SpO2: 100% 99%    Last Pain:  Vitals:   11/12/21 1001  TempSrc:   PainSc: 0-No pain                 Louann Sjogren

## 2021-11-13 IMAGING — MG MM DIGITAL DIAGNOSTIC UNILAT*R* W/ TOMO W/ CAD
4 series · 4 of 12 positions shown · non-contrast
Comparison: Previous exam(s).

CLINICAL DATA: The patient was called back for possible right
breast distortion.

EXAM:
DIGITAL DIAGNOSTIC UNILATERAL RIGHT MAMMOGRAM WITH TOMOSYNTHESIS AND
CAD
TECHNIQUE: Right digital diagnostic mammography and breast tomosynthesis was
performed. The images were evaluated with computer-aided detection.

[R MLO synth-2D]
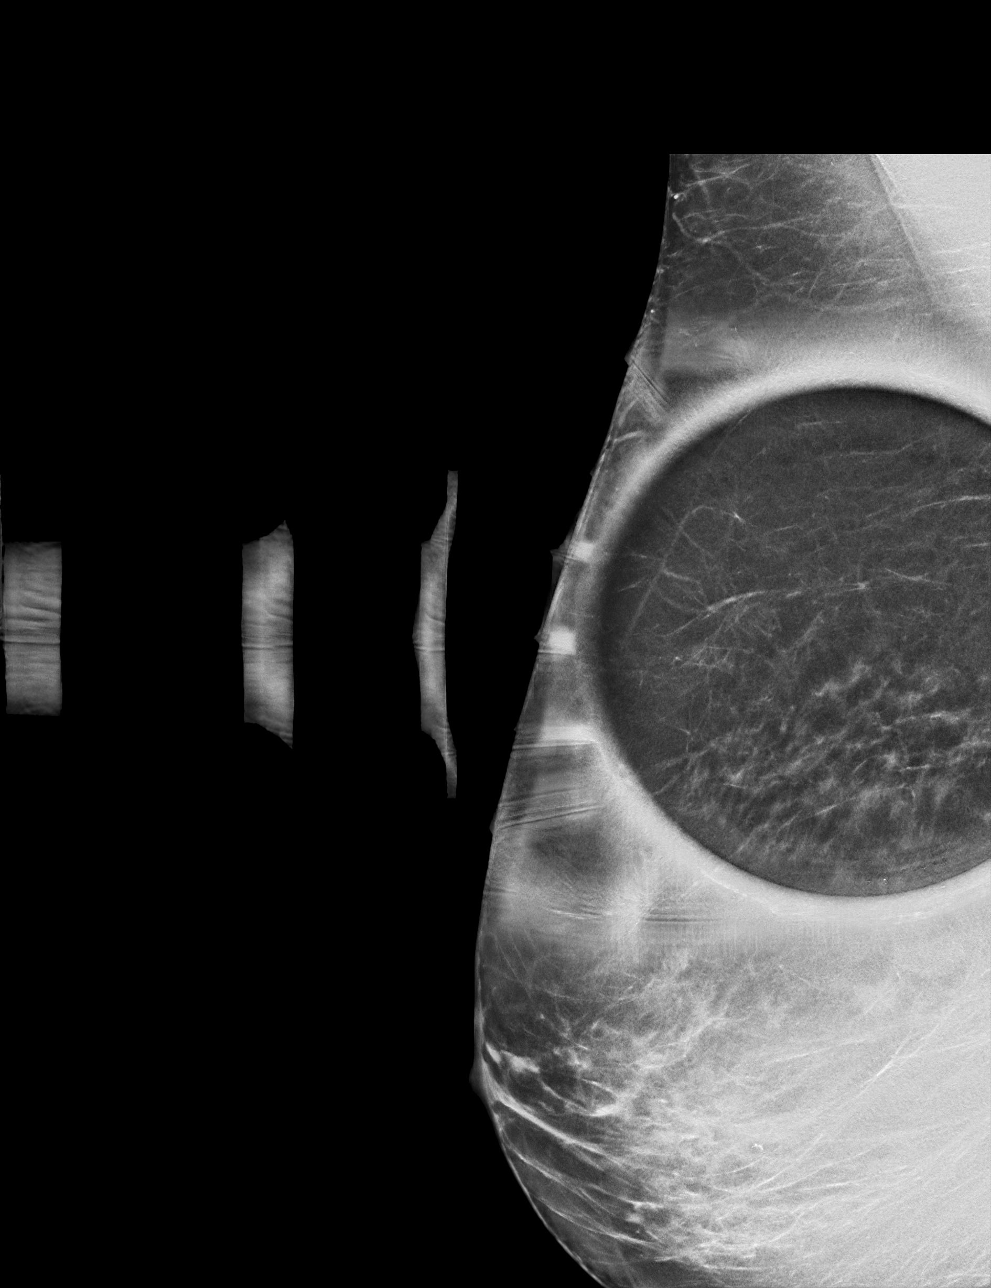

[R ML synth-2D]
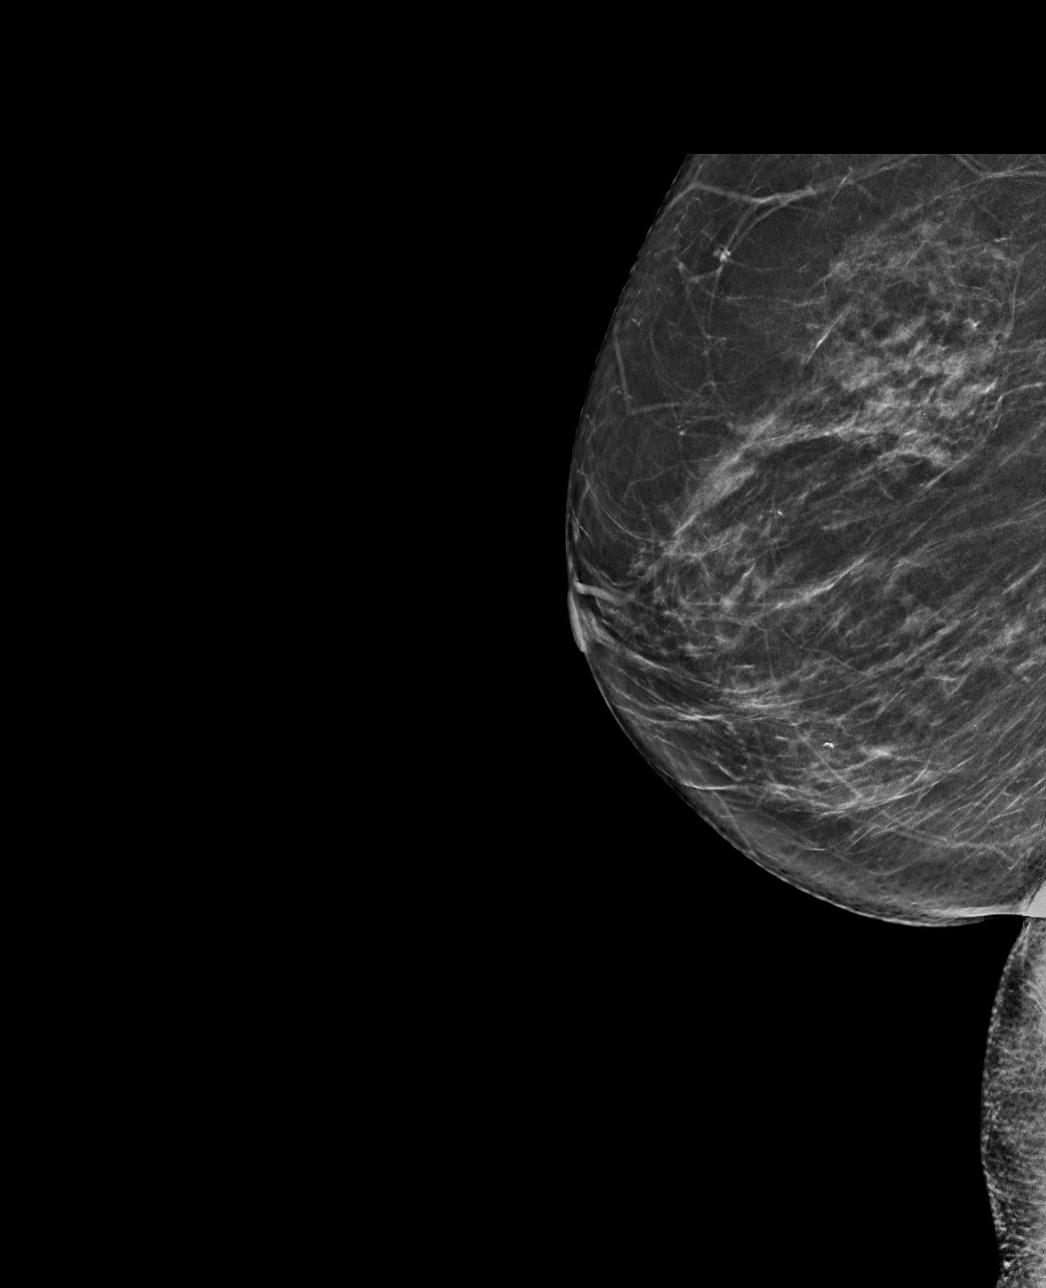

[R MLO tomo · tomo slice 33/65.0]
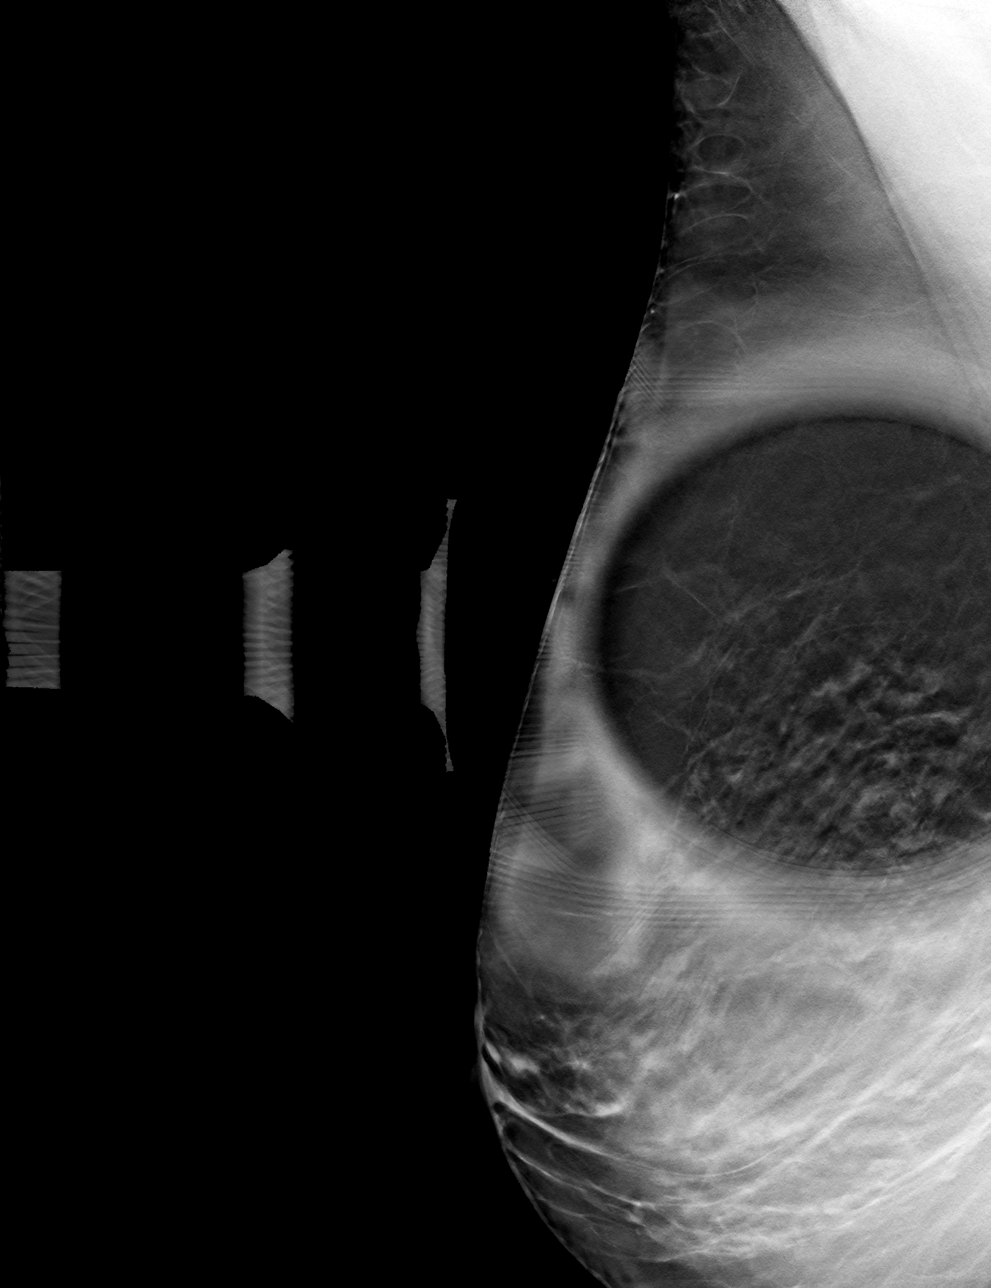

[R ML tomo · tomo slice 37/72.0]
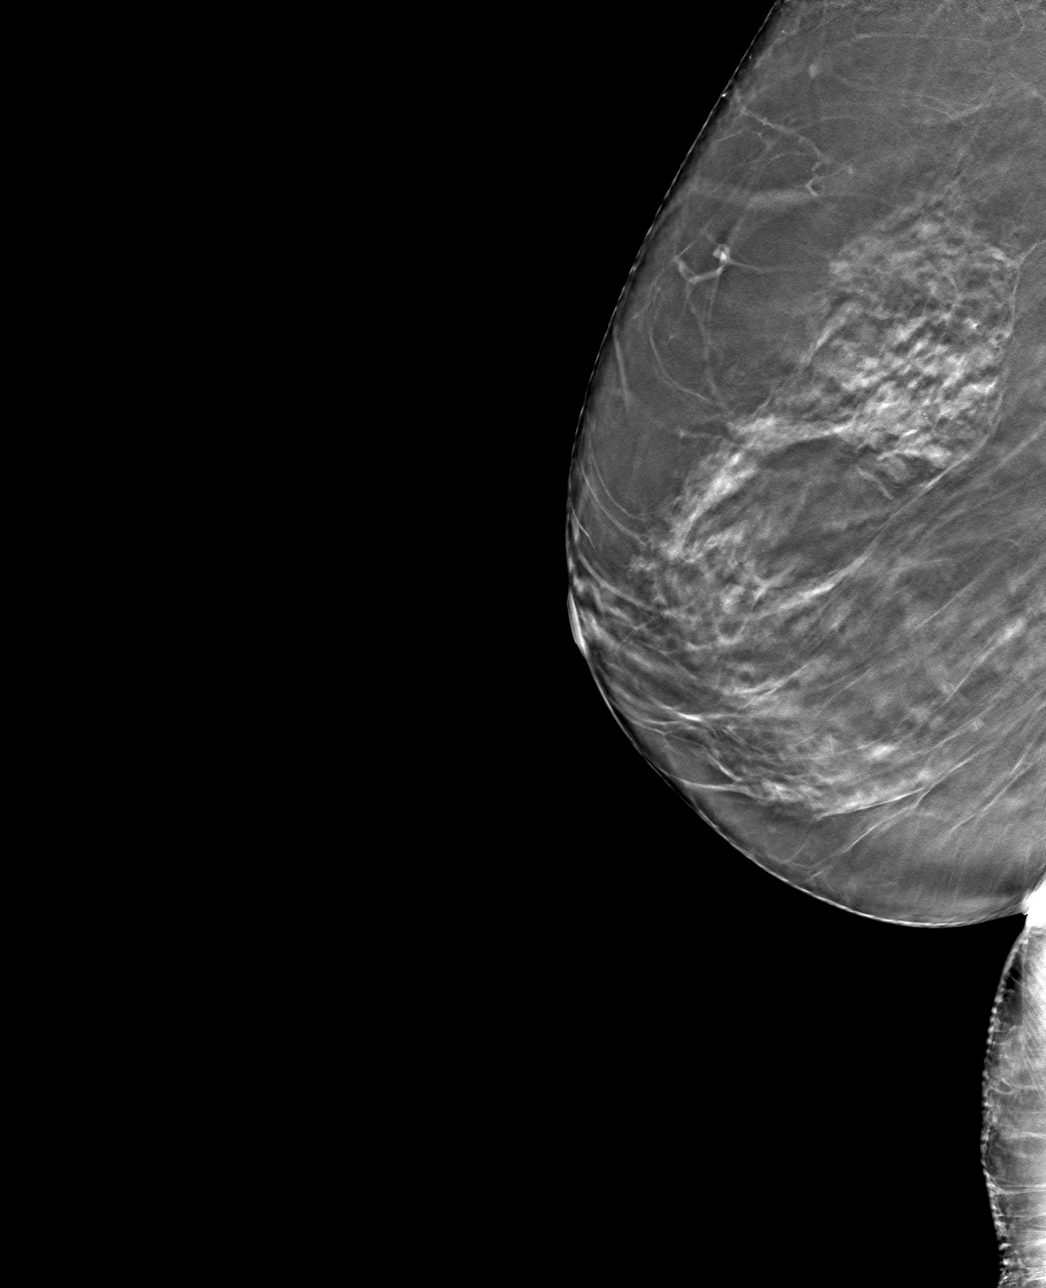

[4 of 12 positions shown; findings below may reference images not displayed]

ACR Breast Density Category b: There are scattered areas of
fibroglandular density.
FINDINGS: The possible right breast distortion resolves on today's imaging.
IMPRESSION: No mammographic evidence of malignancy.

RECOMMENDATION:
Annual screening mammography.

I have discussed the findings and recommendations with the patient.
If applicable, a reminder letter will be sent to the patient
regarding the next appointment.

BI-RADS CATEGORY  1: Negative.

## 2021-11-15 LAB — SURGICAL PATHOLOGY

## 2021-11-15 NOTE — Progress Notes (Signed)
Colonoscopy was performed for: Follow-up, had adenomatous polyps removed in 2012

## 2021-11-16 ENCOUNTER — Encounter (HOSPITAL_COMMUNITY): Payer: Self-pay | Admitting: Gastroenterology

## 2021-11-17 ENCOUNTER — Encounter (INDEPENDENT_AMBULATORY_CARE_PROVIDER_SITE_OTHER): Payer: Self-pay | Admitting: *Deleted

## 2022-02-17 ENCOUNTER — Encounter (INDEPENDENT_AMBULATORY_CARE_PROVIDER_SITE_OTHER): Payer: Self-pay | Admitting: Gastroenterology

## 2022-02-17 ENCOUNTER — Ambulatory Visit (INDEPENDENT_AMBULATORY_CARE_PROVIDER_SITE_OTHER): Payer: Commercial Managed Care - PPO | Admitting: Gastroenterology

## 2022-09-17 ENCOUNTER — Encounter (HOSPITAL_COMMUNITY): Payer: Self-pay

## 2022-09-17 ENCOUNTER — Other Ambulatory Visit: Payer: Self-pay

## 2022-09-17 ENCOUNTER — Emergency Department (HOSPITAL_COMMUNITY)
Admission: EM | Admit: 2022-09-17 | Discharge: 2022-09-18 | Disposition: A | Discharge status: Home / Self Care | Payer: 59 | Attending: Emergency Medicine | Admitting: Emergency Medicine

## 2022-09-17 DIAGNOSIS — Z79899 Other long term (current) drug therapy: Secondary | ICD-10-CM | POA: Insufficient documentation

## 2022-09-17 DIAGNOSIS — G43909 Migraine, unspecified, not intractable, without status migrainosus: Secondary | ICD-10-CM | POA: Insufficient documentation

## 2022-09-17 DIAGNOSIS — R519 Headache, unspecified: Secondary | ICD-10-CM | POA: Diagnosis present

## 2022-09-17 HISTORY — DX: Migraine, unspecified, not intractable, without status migrainosus: G43.909

## 2022-09-17 MED ORDER — VALPROATE SODIUM 100 MG/ML IV SOLN
500.0000 mg | Freq: Once | INTRAVENOUS | Status: AC
  Administered 2022-09-18: 500 mg via INTRAVENOUS
  Filled 2022-09-17: qty 5

## 2022-09-17 MED ORDER — SODIUM CHLORIDE 0.9 % IV BOLUS
1000.0000 mL | Freq: Once | INTRAVENOUS | Status: AC
  Administered 2022-09-18: 1000 mL via INTRAVENOUS

## 2022-09-17 MED ORDER — PROCHLORPERAZINE EDISYLATE 10 MG/2ML IJ SOLN
10.0000 mg | Freq: Once | INTRAMUSCULAR | Status: AC
  Administered 2022-09-17: 10 mg via INTRAVENOUS
  Filled 2022-09-17: qty 2

## 2022-09-17 MED ORDER — KETOROLAC TROMETHAMINE 30 MG/ML IJ SOLN
15.0000 mg | Freq: Once | INTRAMUSCULAR | Status: AC
  Administered 2022-09-17: 15 mg via INTRAVENOUS
  Filled 2022-09-17: qty 1

## 2022-09-17 MED ORDER — DIPHENHYDRAMINE HCL 50 MG/ML IJ SOLN
25.0000 mg | Freq: Once | INTRAMUSCULAR | Status: AC
  Administered 2022-09-18: 25 mg via INTRAVENOUS
  Filled 2022-09-17: qty 1

## 2022-09-17 NOTE — ED Provider Notes (Signed)
[facility] EMERGENCY DEPARTMENT Provider Note   CSN: [id number] Arrival date & time: 09/17/22  2243     History {Add pertinent medical, surgical, social history, OB history to HPI:1} Chief Complaint  Patient presents with   Migraine    Amy Goodman is a 61 y.o. female.  Patient presents to the emergency department for evaluation of migraine headache.  Patient reports that she has a history of migraines with similar symptoms.  She has taken her triptan without improvement.  Symptoms present for 2 days.  Patient reports that this is typical for her migraines, no unusual or new features.       Home Medications Prior to Admission medications   Medication Sig Start Date End Date Taking? Authorizing Provider  eletriptan (RELPAX) 20 MG tablet Take 20 mg by mouth as needed for migraine or headache. May repeat in 2 hours if headache persists or recurs.   Yes [provider]  ferrous sulfate 325 (65 FE) MG tablet Take 1 tablet by mouth daily. 09/16/22 10/16/22 Yes [provider]  hydrOXYzine (ATARAX) 25 MG tablet Take by mouth. 09/16/22  Yes [provider]  nitrofurantoin, macrocrystal-monohydrate, (MACROBID) 100 MG capsule Take by mouth. 09/15/22 09/20/22 Yes [provider]  acetaminophen (TYLENOL) 500 MG tablet Take 500 mg by mouth every 6 (six) hours as needed for headache or moderate pain.    [provider]  Ascorbic Acid (VITAMIN C) 1000 MG tablet Take 1,000 mg by mouth daily.    [provider]  dicyclomine (BENTYL) 10 MG capsule Take 1 capsule (10 mg total) by mouth 3 (three) times daily as needed for spasms. 10/28/21   Carlan, Deatra Robinson, NP  escitalopram (LEXAPRO) 20 MG tablet TAKE 1 TABLET BY MOUTH DAILY 07/03/20 11/12/21  Sharilyn Sites, MD  Immune Globulin, Human, (PRIVIGEN) 40 GM/400ML SOLN Inject into the vein every 8 (eight) weeks.    [provider]  meclizine (ANTIVERT) 25 MG tablet Take 1 tablet (25 mg total) by  mouth 3 (three) times daily as needed for dizziness. 08/29/19   Hayden Rasmussen, MD  phentermine (ADIPEX-P) 37.5 MG tablet Take 37.5 mg by mouth every morning. 10/06/21   [provider]  SUMAtriptan (IMITREX) 25 MG tablet Take 25 mg by mouth every 2 (two) hours as needed for migraine. May repeat in 2 hours if headache persists or recurs.    [provider]      Allergies    Amoxicillin and Penicillins    Review of Systems   Review of Systems  Physical Exam Updated Vital Signs BP 116/77 (BP Location: Right Arm)   Pulse (!) 109   Temp 98.8 F (37.1 C)   Resp 16   Ht '5\' 4"'$  (1.626 m)   Wt 74.4 kg   SpO2 99%   BMI 28.15 kg/m  Physical Exam Vitals and nursing note reviewed.  Constitutional:      General: She is not in acute distress.    Appearance: She is well-developed.  HENT:     Head: Normocephalic and atraumatic.     Mouth/Throat:     Mouth: Mucous membranes are moist.  Eyes:     General: Vision grossly intact. Gaze aligned appropriately.     Extraocular Movements: Extraocular movements intact.     Conjunctiva/sclera: Conjunctivae normal.  Cardiovascular:     Rate and Rhythm: Normal rate and regular rhythm.     Pulses: Normal pulses.     Heart sounds: Normal heart sounds, S1 normal  and S2 normal. No murmur heard.    No friction rub. No gallop.  Pulmonary:     Effort: Pulmonary effort is normal. No respiratory distress.     Breath sounds: Normal breath sounds.  Abdominal:     General: Bowel sounds are normal.     Palpations: Abdomen is soft.     Tenderness: There is no abdominal tenderness. There is no guarding or rebound.     Hernia: No hernia is present.  Musculoskeletal:        General: No swelling.     Cervical back: Full passive range of motion without pain, normal range of motion and neck supple. No spinous process tenderness or muscular tenderness. Normal range of motion.     Right lower leg: No edema.     Left lower leg: No edema.  Skin:     General: Skin is warm and dry.     Capillary Refill: Capillary refill takes less than 2 seconds.     Findings: No ecchymosis, erythema, rash or wound.  Neurological:     General: No focal deficit present.     Mental Status: She is alert and oriented to person, place, and time.     GCS: GCS eye subscore is 4. GCS verbal subscore is 5. GCS motor subscore is 6.     Cranial Nerves: Cranial nerves 2-12 are intact.     Sensory: Sensation is intact.     Motor: Motor function is intact.     Coordination: Coordination is intact.  Psychiatric:        Attention and Perception: Attention normal.        Mood and Affect: Mood normal.        Speech: Speech normal.        Behavior: Behavior normal.     ED Results / Procedures / Treatments   Labs (all labs ordered are listed, but only abnormal results are displayed) Labs Reviewed - No data to display  EKG None  Radiology No results found.  Procedures Procedures  {Document cardiac monitor, telemetry assessment procedure when appropriate:1}  Medications Ordered in ED Medications  ketorolac (TORADOL) 30 MG/ML injection 15 mg (has no administration in time range)  prochlorperazine (COMPAZINE) injection 10 mg (has no administration in time range)  diphenhydrAMINE (BENADRYL) injection 25 mg (has no administration in time range)  sodium chloride 0.9 % bolus 1,000 mL (has no administration in time range)  valproate (DEPACON) 500 mg in dextrose 5 % 50 mL IVPB (has no administration in time range)    ED Course/ Medical Decision Making/ A&P                           Medical Decision Making Risk Prescription drug management.   Patient presents to the emergency department for evaluation of migraine headache. Patient is currently experiencing a headache that is similar to previous migraines. Patient does not have any unusual features compared to previous migraines. Patient has normal neurologic examination. There are no unusual features, such  as unusual intensity or sudden onset. As this headache is similar to previous migraines, there is no concern for subarachnoid hemorrhage or other etiology. Patient therefore does not require imaging. Patient treated as migraine headache.  {Document critical care time when appropriate:1} {Document review of labs and clinical decision tools ie heart score, Chads2Vasc2 etc:1}  {Document your independent review of radiology images, and any outside records:1} {Document your discussion with family members, caretakers, and with  consultants:1} {Document social determinants of health affecting pt's care:1} {Document your decision making why or why not admission, treatments were needed:1} Final Clinical Impression(s) / ED Diagnoses Final diagnoses:  Migraine without status migrainosus, not intractable, unspecified migraine type    Rx / DC Orders ED Discharge Orders     None

## 2022-09-17 NOTE — ED Triage Notes (Signed)
Pt c/o migraine since 09/15/22, where she was seen at [facility] ED in [place], pt has taken eletriptan for the past two days which normally relieves migraines, but has not this time.

## 2022-09-18 MED ORDER — VALPROATE SODIUM 500 MG/5ML IV SOLN
INTRAVENOUS | Status: AC
  Filled 2022-09-18: qty 5

## 2022-10-04 ENCOUNTER — Other Ambulatory Visit: Payer: Self-pay | Admitting: Family Medicine

## 2022-10-04 DIAGNOSIS — Z1231 Encounter for screening mammogram for malignant neoplasm of breast: Secondary | ICD-10-CM

## 2022-11-07 ENCOUNTER — Ambulatory Visit: Payer: 59

## 2022-11-28 ENCOUNTER — Ambulatory Visit
Admission: RE | Admit: 2022-11-28 | Discharge: 2022-11-28 | Disposition: A | Discharge status: Home / Self Care | Payer: 59 | Source: Ambulatory Visit | Attending: Family Medicine | Admitting: Family Medicine

## 2022-11-28 DIAGNOSIS — Z1231 Encounter for screening mammogram for malignant neoplasm of breast: Secondary | ICD-10-CM

## 2023-11-03 ENCOUNTER — Other Ambulatory Visit: Payer: Self-pay | Admitting: Family Medicine

## 2023-11-03 DIAGNOSIS — Z1231 Encounter for screening mammogram for malignant neoplasm of breast: Secondary | ICD-10-CM

## 2023-11-30 ENCOUNTER — Ambulatory Visit
Admission: RE | Admit: 2023-11-30 | Discharge: 2023-11-30 | Disposition: A | Discharge status: Home / Self Care | Payer: 59 | Source: Ambulatory Visit | Attending: Family Medicine | Admitting: Family Medicine

## 2023-11-30 DIAGNOSIS — Z1231 Encounter for screening mammogram for malignant neoplasm of breast: Secondary | ICD-10-CM

## 2024-10-31 ENCOUNTER — Other Ambulatory Visit: Payer: Self-pay | Admitting: Family Medicine

## 2024-10-31 DIAGNOSIS — Z1231 Encounter for screening mammogram for malignant neoplasm of breast: Secondary | ICD-10-CM

## 2024-12-03 ENCOUNTER — Ambulatory Visit

## 2024-12-06 ENCOUNTER — Inpatient Hospital Stay: Admission: RE | Admit: 2024-12-06 | Discharge: 2024-12-06 | Attending: Family Medicine | Admitting: Family Medicine

## 2024-12-06 DIAGNOSIS — Z1231 Encounter for screening mammogram for malignant neoplasm of breast: Secondary | ICD-10-CM
